# Patient Record
Sex: Male | Born: 1945 | Race: Black or African American | Hispanic: No | Marital: Married | Smoking: Never smoker
Health system: Southern US, Community
[De-identification: ages and names within clinical notes are randomized; demographics above are authoritative.]

## PROBLEM LIST (undated history)

## (undated) DIAGNOSIS — K635 Polyp of colon: Secondary | ICD-10-CM

## (undated) DIAGNOSIS — H409 Unspecified glaucoma: Secondary | ICD-10-CM

## (undated) DIAGNOSIS — D649 Anemia, unspecified: Secondary | ICD-10-CM

## (undated) HISTORY — DX: Polyp of colon: K63.5

## (undated) HISTORY — PX: VASECTOMY: SHX75

## (undated) HISTORY — DX: Unspecified glaucoma: H40.9

## (undated) HISTORY — DX: Anemia, unspecified: D64.9

---

## 1998-02-14 ENCOUNTER — Ambulatory Visit (HOSPITAL_COMMUNITY): Admission: RE | Admit: 1998-02-14 | Discharge: 1998-02-14 | Payer: Self-pay | Admitting: Gastroenterology

## 1998-12-13 ENCOUNTER — Encounter: Admission: RE | Admit: 1998-12-13 | Discharge: 1998-12-13 | Payer: Self-pay | Admitting: Internal Medicine

## 1998-12-13 ENCOUNTER — Encounter: Payer: Self-pay | Admitting: Internal Medicine

## 2000-11-13 ENCOUNTER — Encounter: Admission: RE | Admit: 2000-11-13 | Discharge: 2000-11-13 | Payer: Self-pay | Admitting: Internal Medicine

## 2000-11-13 ENCOUNTER — Encounter: Payer: Self-pay | Admitting: Internal Medicine

## 2003-03-15 ENCOUNTER — Encounter: Admission: RE | Admit: 2003-03-15 | Discharge: 2003-03-15 | Payer: Self-pay | Admitting: Internal Medicine

## 2004-02-28 ENCOUNTER — Ambulatory Visit (HOSPITAL_COMMUNITY): Admission: RE | Admit: 2004-02-28 | Discharge: 2004-02-28 | Payer: Self-pay | Admitting: Gastroenterology

## 2004-02-28 ENCOUNTER — Encounter (INDEPENDENT_AMBULATORY_CARE_PROVIDER_SITE_OTHER): Payer: Self-pay | Admitting: *Deleted

## 2005-10-14 ENCOUNTER — Encounter: Admission: RE | Admit: 2005-10-14 | Discharge: 2005-10-14 | Payer: Self-pay | Admitting: Internal Medicine

## 2008-03-22 ENCOUNTER — Encounter: Admission: RE | Admit: 2008-03-22 | Discharge: 2008-03-22 | Payer: Self-pay | Admitting: Internal Medicine

## 2009-11-13 ENCOUNTER — Emergency Department (HOSPITAL_COMMUNITY): Admission: EM | Admit: 2009-11-13 | Discharge: 2009-11-13 | Payer: Self-pay | Admitting: Emergency Medicine

## 2009-11-24 ENCOUNTER — Emergency Department (HOSPITAL_COMMUNITY): Admission: EM | Admit: 2009-11-24 | Discharge: 2009-11-24 | Payer: Self-pay | Admitting: Emergency Medicine

## 2010-04-24 LAB — URINE CULTURE: Colony Count: 15000

## 2010-04-24 LAB — URINALYSIS, ROUTINE W REFLEX MICROSCOPIC
Bilirubin Urine: NEGATIVE
Nitrite: NEGATIVE
Protein, ur: NEGATIVE mg/dL
Specific Gravity, Urine: 1.018 (ref 1.005–1.030)
Urobilinogen, UA: 0.2 mg/dL (ref 0.0–1.0)

## 2010-04-24 LAB — URINE MICROSCOPIC-ADD ON

## 2010-04-25 LAB — CBC
MCH: 31.9 pg (ref 26.0–34.0)
MCHC: 35 g/dL (ref 30.0–36.0)
MCV: 91.2 fL (ref 78.0–100.0)
Platelets: 135 10*3/uL — ABNORMAL LOW (ref 150–400)

## 2010-04-25 LAB — COMPREHENSIVE METABOLIC PANEL
ALT: 26 U/L (ref 0–53)
Alkaline Phosphatase: 87 U/L (ref 39–117)
GFR calc Af Amer: 44 mL/min — ABNORMAL LOW (ref >60)
Glucose, Bld: 93 mg/dL (ref 70–99)
Potassium: 3.5 mEq/L (ref 3.5–5.1)
Total Protein: 7.1 g/dL (ref 6.0–8.3)

## 2010-04-25 LAB — URINALYSIS, ROUTINE W REFLEX MICROSCOPIC
Bilirubin Urine: NEGATIVE
Ketones, ur: 40 mg/dL — AB
Protein, ur: NEGATIVE mg/dL
Urobilinogen, UA: 0.2 mg/dL (ref 0.0–1.0)

## 2010-04-25 LAB — URINE CULTURE
Colony Count: NO GROWTH
Culture  Setup Time: 201110042323
Culture: NO GROWTH

## 2010-04-25 LAB — DIFFERENTIAL
Basophils Relative: 0 % (ref 0–1)
Eosinophils Absolute: 0 10*3/uL (ref 0.0–0.7)
Monocytes Absolute: 0.6 10*3/uL (ref 0.1–1.0)
Monocytes Relative: 7 % (ref 3–12)
Neutrophils Relative %: 86 % — ABNORMAL HIGH (ref 43–77)

## 2010-06-28 NOTE — Op Note (Signed)
Billy Norman, Billy Norman                ACCOUNT NO.:  [id number]   MEDICAL RECORD NO.:  [id number]          PATIENT TYPE:  AMB   LOCATION:  ENDO                         FACILITY:  [facility]   PHYSICIAN:  Anselmo Rod, M.D.  DATE OF BIRTH:  1945/08/28   DATE OF PROCEDURE:  02/28/2004  DATE OF DISCHARGE:                                 OPERATIVE REPORT   PROCEDURE PERFORMED:  Colonoscopy with cold biopsies x 2.   ENDOSCOPIST:  Anselmo Rod, M.D.   INSTRUMENT USED:  Olympus video colonoscope.   INDICATIONS FOR PROCEDURE:  A 65 year old, African-American male undergoing  screening colonoscopy to rule out colonic polyps, masses, etc.   PREPROCEDURE PREPARATION:  Informed consent was procured from the patient.  The patient was fasted for 8 hours prior to the procedure and prepped with a  bottle of magnesium citrate and a gallon of GoLYTELY the night prior to the  procedure.  Risks and benefits of the procedure, including a 10% missed rate  of cancer and polyps was discussed with the patient as well.   PREPROCEDURE PHYSICAL:  VITAL SIGNS:  The patient with stable vital signs.  NECK:  Supple.  CHEST:  Clear to auscultation.  CARDIAC:  S1, S2, regular.  ABDOMEN:  Soft with normal bowel sounds.   DESCRIPTION OF PROCEDURE:  The patient was placed in the left lateral  decubitus position, sedated with 100 mg of Demerol and 7.5 mg of Versed in  slow, incremental doses.  Once the patient was adequately sedated and  maintained on low-flow oxygen and continuous cardiac monitoring, the Olympus  video colonoscope was advanced from the rectum to the cecum, the appendiceal  orifice and ileocecal valve were visualized and photographed.  A small  sessile polyp was biopsied x 2 (cold biopsies) from the mid right colon.  There was no evidence of diverticulosis.  No other masses or polyps were  seen.  Retroflexion in the rectum revealed no abnormalities.  The patient  tolerated the procedure well without  complications.   IMPRESSION:  Essentially normal colonoscopy up to the cecum except for a  small sessile polyp biopsied x 2 (cold biopsies) from mid right colon.   RECOMMENDATIONS:  1.  Repeat colonoscopy depending on pathology results.  2.  Avoid nonsteroidals including aspirin for the next 2 weeks.  3.  Outpatient follow up as need arises in the future.      Jyot   JNM/MEDQ  D:  02/28/2004  T:  02/28/2004  Job:  [id number]   cc:   Minerva Areola L. August Saucer, M.D.  [address] [address]  [place]  [place] [address]  Fax: [phone]

## 2011-02-03 ENCOUNTER — Ambulatory Visit (INDEPENDENT_AMBULATORY_CARE_PROVIDER_SITE_OTHER): Payer: BC Managed Care – PPO

## 2011-02-03 DIAGNOSIS — S339XXA Sprain of unspecified parts of lumbar spine and pelvis, initial encounter: Secondary | ICD-10-CM

## 2011-06-13 ENCOUNTER — Ambulatory Visit
Admission: RE | Admit: 2011-06-13 | Discharge: 2011-06-13 | Disposition: A | Discharge status: Home / Self Care | Payer: BC Managed Care – PPO | Source: Ambulatory Visit | Attending: Internal Medicine | Admitting: Internal Medicine

## 2011-06-13 ENCOUNTER — Other Ambulatory Visit: Payer: Self-pay | Admitting: Internal Medicine

## 2011-06-13 DIAGNOSIS — R1030 Lower abdominal pain, unspecified: Secondary | ICD-10-CM

## 2011-10-13 IMAGING — CT CT ABD-PELV W/ CM
2 of 7 series · 16 of 46 positions shown, 18 images · IV contrast (water & 80ml omni 300)
Comparison: None.

CLINICAL DATA: Right lower quadrant pain

CT ABDOMEN WITH CONTRAST
TECHNIQUE: Multidetector CT imaging of the abdomen was performed
using the standard protocol following bolus administration of
intravenous contrast.
Contrast: 80 ml of Hmnipaque-055

[Series 2: routine abdomen · axial · 0.96mm/px · z∈[-412,-52]mm · 13 of 86 slices shown, 15 images]
[im 7/86  soft-tissue]
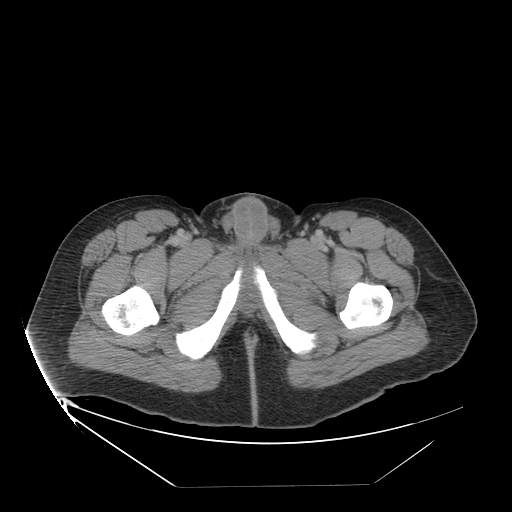
[im 7/86  bone]
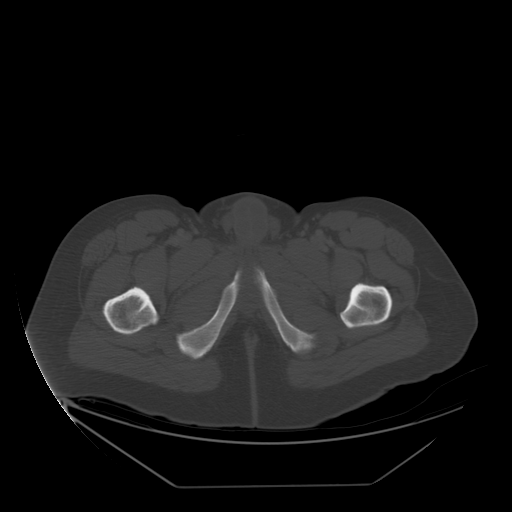
[im 13/86  soft-tissue]
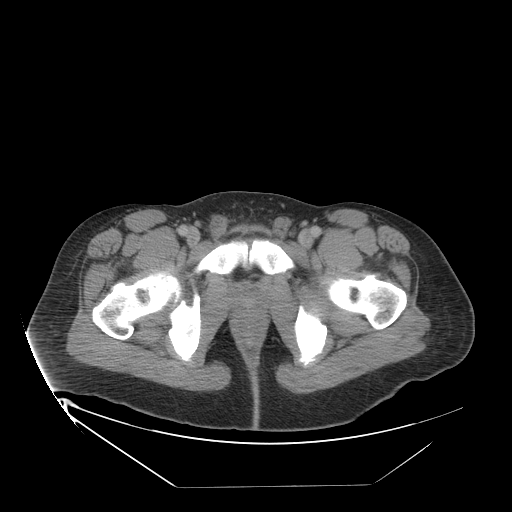
[im 19/86  soft-tissue]
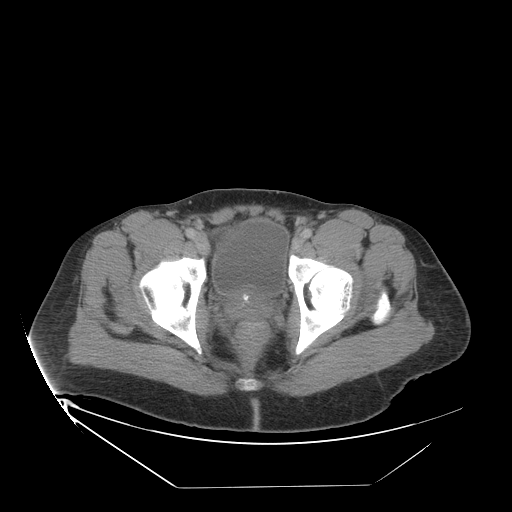
[im 25/86  soft-tissue]
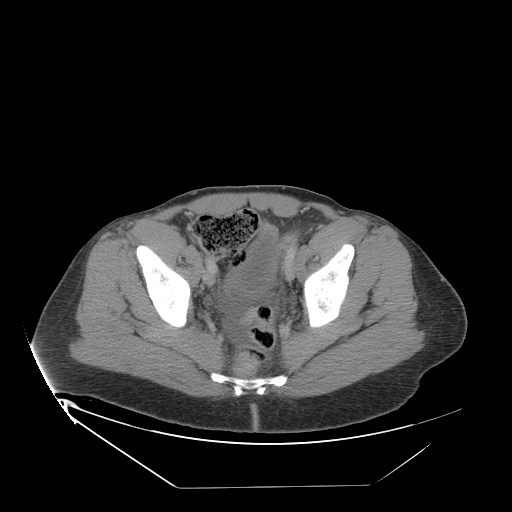
[im 31/86  soft-tissue]
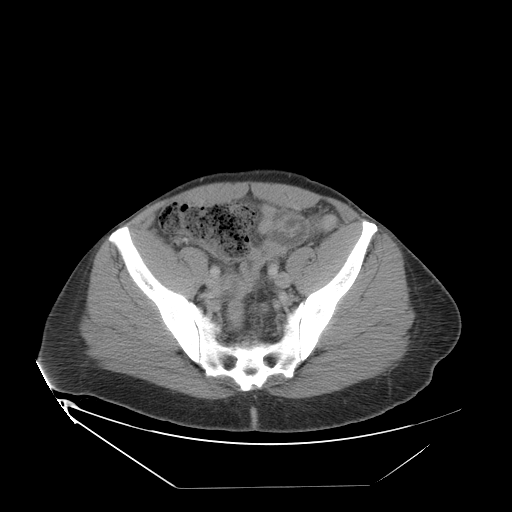
[im 37/86  soft-tissue]
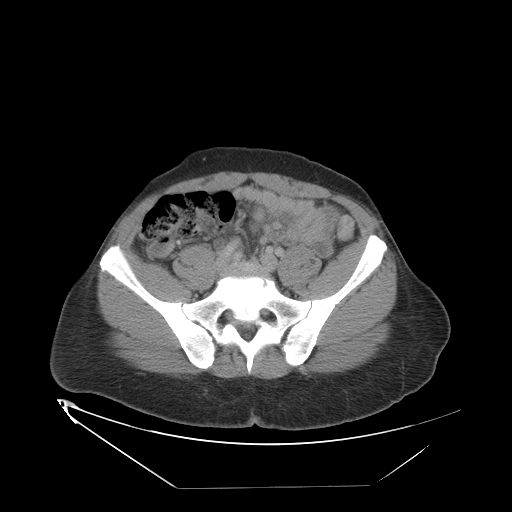
[im 43/86  soft-tissue]
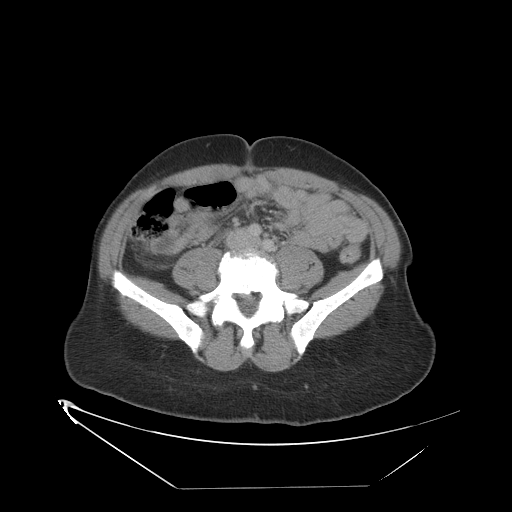
[im 49/86  soft-tissue]
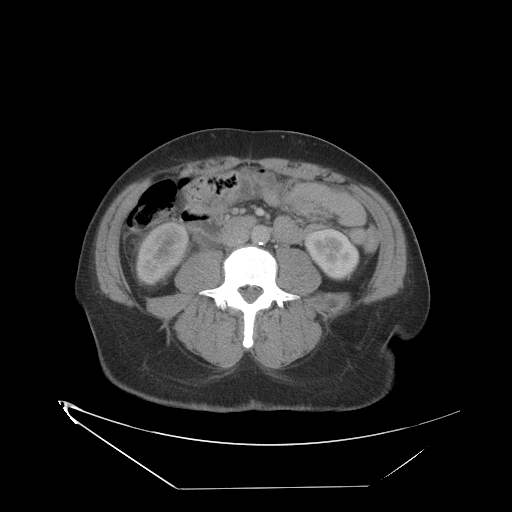
[im 55/86  soft-tissue]
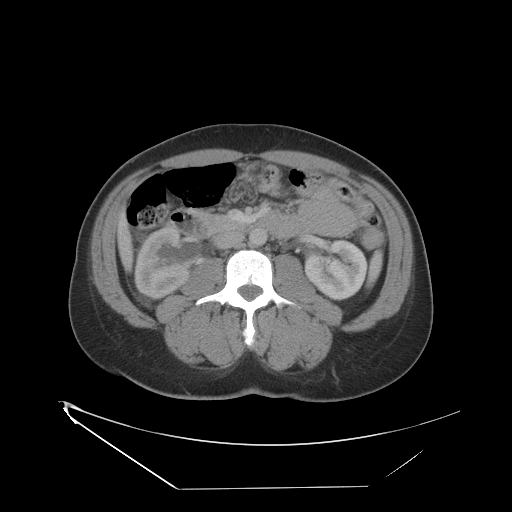
[im 55/86  bone]
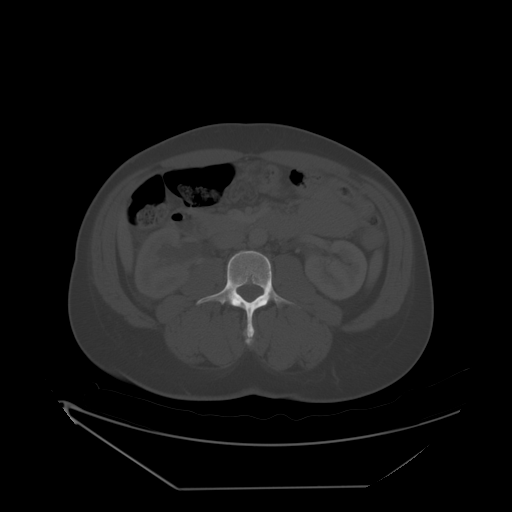
[im 61/86  soft-tissue]
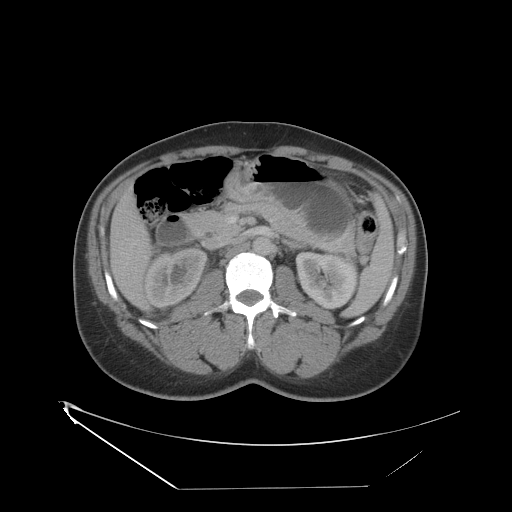
[im 67/86  soft-tissue]
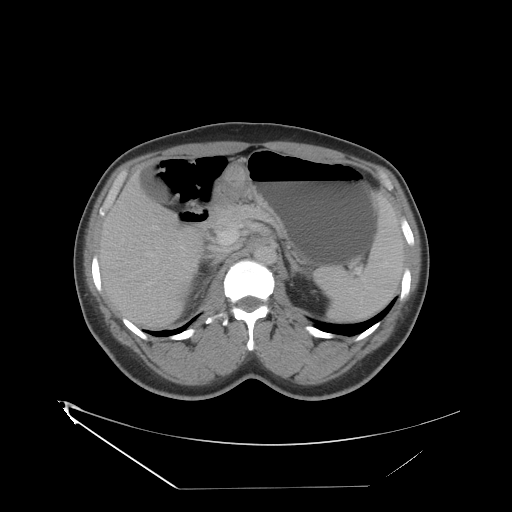
[im 73/86  soft-tissue]
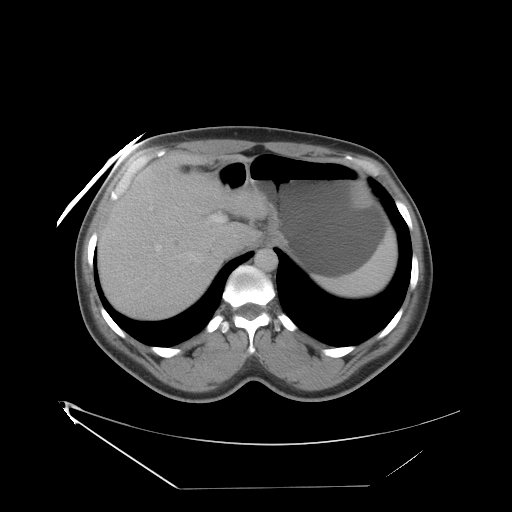
[im 79/86  soft-tissue]
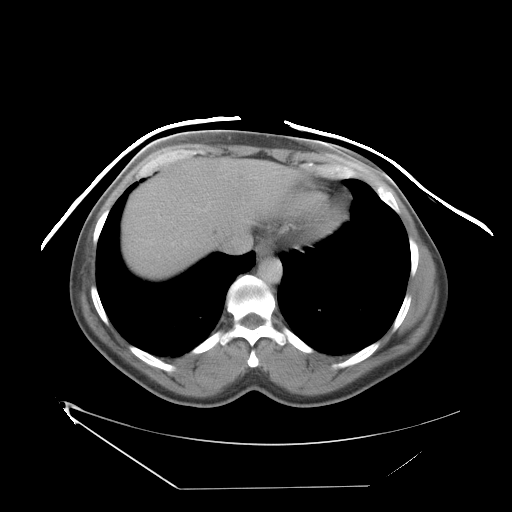

[Series 401: coronal · coronal · 0.96mm/px · 3 of 92 slices shown]
[im 31/92  soft-tissue]
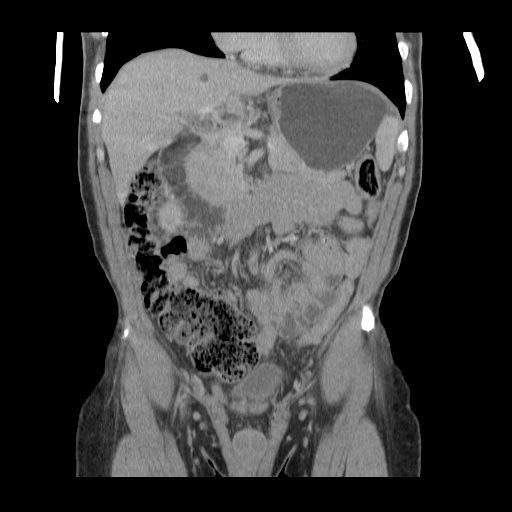
[im 41/92  soft-tissue]
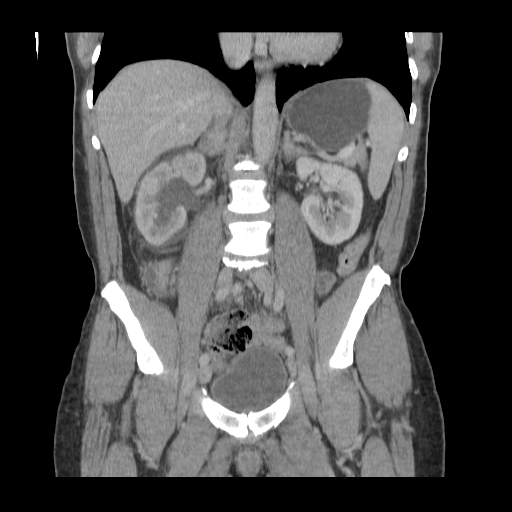
[im 51/92  soft-tissue]
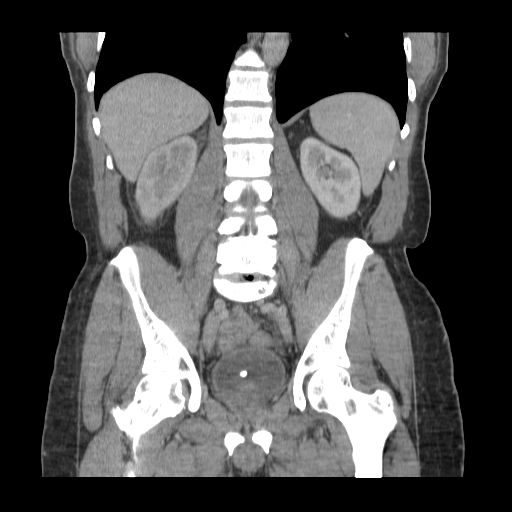

[16 of 46 positions shown; findings below may reference images not displayed]

FINDINGS: The lung bases are clear.  There are six sub centimeter
hypoattenuating lesions seen in the liver involving both lobes
which are too small to characterize.  The liver is otherwise
normal.  The spleen, adrenal glands, gallbladder and pancreas are
within normal limits.  There is asymmetric enhancement the kidneys
with delayed enhancement and excretion in the right kidney.  An 8
mm simple cyst is seen from the upper pole right kidney.  There is
increased perinephric stranding and enlargement of the right kidney
and hydronephrosis.  There is a punctate nonobstructing stone in
the anterior interpolar region.  The ureter is dilated to the UV
junction where there is an oval 0.8 x 0.6 cm stone.  No left
ureteral stone is seen.  There is a dependent calcification seen
along the bladder wall, likely, a previously passed stone.  The
prostate is within normal limits.  There is a small volume of free
fluid seen dependently in the pelvis.  The stomach is incompletely
distended.  There is no small bowel obstruction identified.  Normal
appendix is seen in the right lower quadrant.  There is a large
volume of stool seen particularly involving the right colon.  No
aggressive lytic or blastic bone lesions are seen.  Severe
degenerative changes are seen L4-L5.
IMPRESSION: 1.  Obstructing right UVJ stone with delayed enhancement and
excretion in the right kidney.
2.  Incidental findings as detailed above.  J

## 2011-10-29 ENCOUNTER — Ambulatory Visit (INDEPENDENT_AMBULATORY_CARE_PROVIDER_SITE_OTHER): Payer: BC Managed Care – PPO | Admitting: Family Medicine

## 2011-10-29 ENCOUNTER — Ambulatory Visit: Payer: BC Managed Care – PPO

## 2011-10-29 VITALS — BP 130/84 | HR 70 | Temp 97.9°F | Resp 16 | Ht 72.0 in | Wt 216.2 lb

## 2011-10-29 DIAGNOSIS — R109 Unspecified abdominal pain: Secondary | ICD-10-CM

## 2011-10-29 LAB — POCT URINALYSIS DIPSTICK
Protein, UA: 30
Spec Grav, UA: 1.015
Urobilinogen, UA: 0.2

## 2011-10-29 LAB — POCT UA - MICROSCOPIC ONLY
Crystals, Ur, HPF, POC: NEGATIVE
Yeast, UA: NEGATIVE

## 2011-10-29 MED ORDER — ONDANSETRON HCL 4 MG PO TABS: 4.0000 mg | ORAL_TABLET | Freq: Three times a day (TID) | ORAL | Status: DC | PRN

## 2011-10-29 MED ORDER — TAMSULOSIN HCL 0.4 MG PO CAPS: 0.4000 mg | ORAL_CAPSULE | Freq: Every day | ORAL | Status: DC

## 2011-10-29 NOTE — Progress Notes (Signed)
  Subjective:    Patient ID: Billy Norman., male    DOB: January 12, 1946, 66 y.o.   MRN: [id number]  HPI Pt presents today with chief complaint of nausea.  Pt states that he has a baseline of kidney stones. Pt believes that he may have passed a kidney stone No fevers or chills.  Pain is predominantly R sided.  Sxs fairly mild in comparison to previous renal stones.  Minimal UTI sxs.     Review of Systems  All other systems reviewed and are negative.       Objective:   Physical Exam  Constitutional: He appears well-developed and well-nourished.  HENT:  Head: Normocephalic and atraumatic.  Right Ear: External ear normal.  Left Ear: External ear normal.  Eyes: Conjunctivae normal are normal. Pupils are equal, round, and reactive to light.  Neck: Normal range of motion.  Cardiovascular: Normal rate and regular rhythm.   Pulmonary/Chest: Effort normal.  Abdominal: Soft. He exhibits no distension. There is no tenderness. There is no rebound and no guarding.       Mild R sided flank pain.    Musculoskeletal: Normal range of motion.  Neurological: He is alert.  Skin: Skin is warm.     [facility] reading (PRIMARY) by  Dr. Alvester Morin: No overt kidney stones on preliminary imaging.        Assessment & Plan:  Will treat symptomatically with zofran.  Flomax and strainer at home.  No signs of urinary infection on UA.  Follow up if sxs persist vs. Urology.

## 2012-04-01 ENCOUNTER — Encounter: Payer: Self-pay | Admitting: *Deleted

## 2012-08-07 ENCOUNTER — Encounter: Payer: Self-pay | Admitting: Family Medicine

## 2012-08-07 ENCOUNTER — Ambulatory Visit (INDEPENDENT_AMBULATORY_CARE_PROVIDER_SITE_OTHER): Payer: Medicare Other | Admitting: Family Medicine

## 2012-08-07 DIAGNOSIS — T6391XA Toxic effect of contact with unspecified venomous animal, accidental (unintentional), initial encounter: Secondary | ICD-10-CM

## 2012-08-07 MED ORDER — PREDNISONE 20 MG PO TABS: ORAL_TABLET | ORAL | Status: DC

## 2012-08-07 NOTE — Progress Notes (Signed)
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 He's been stung before and generally does not need medical attention for this. Nevertheless he started have some swelling around his right eye and wanted some attention.  Patient does have an EpiPen available. He's had no shortness of breath, chest pain, mild swelling  Objective: Mild swelling below the right eye where he was stung says he with erythema.  Has a 1 cm area of erythema in the right antecubital area..  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  Signed, Lewayne Bunting M.D.

## 2012-08-07 NOTE — Patient Instructions (Addendum)
Bee, Wasp, or Hornet Sting °Your caregiver has diagnosed you as having an insect sting. An insect sting appears as a red lump in the skin that sometimes has a tiny hole in the center, or it may have a stinger in the center of the wound. The most common stings are from wasps, hornets and bees. °Individuals have different reactions to insect stings. °· A normal reaction may cause pain, swelling, and redness around the sting site. °· A localized allergic reaction may cause swelling and redness that extends beyond the sting site. °· A large local reaction may continue to develop over the next 12 to 36 hours. °· On occasion, the reactions can be severe (anaphylactic reaction). An anaphylactic reaction may cause wheezing; difficulty breathing; chest pain; fainting; raised, itchy, red patches on the skin; a sick feeling to your stomach (nausea); vomiting; cramping; or diarrhea. If you have had an anaphylactic reaction to an insect sting in the past, you are more likely to have one again. °HOME CARE INSTRUCTIONS  °· With bee stings, a small sac of poison is left in the wound. Brushing across this with something such as a credit card, or anything similar, will help remove this and decrease the amount of the reaction. This same procedure will not help a wasp sting as they do not leave behind a stinger and poison sac. °· Apply a cold compress for 10 to 20 minutes every hour for 1 to 2 days, depending on severity, to reduce swelling and itching. °· To lessen pain, a paste made of water and baking soda may be rubbed on the bite or sting and left on for 5 minutes. °· To relieve itching and swelling, you may use take medication or apply medicated creams or lotions as directed. °· Only take over-the-counter or prescription medicines for pain, discomfort, or fever as directed by your caregiver. °· Wash the sting site daily with soap and water. Apply antibiotic ointment on the sting site as directed. °· If you suffered a severe  reaction: °· If you did not require hospitalization, an adult will need to stay with you for 24 hours in case the symptoms return. °· You may need to wear a medical bracelet or necklace stating the allergy. °· You and your family need to learn when and how to use an anaphylaxis kit or epinephrine injection. °· If you have had a severe reaction before, always carry your anaphylaxis kit with you. °SEEK MEDICAL CARE IF:  °· None of the above helps within 2 to 3 days. °· The area becomes red, warm, tender, and swollen beyond the area of the bite or sting. °· You have an oral temperature above 102° F (38.9° C). °SEEK IMMEDIATE MEDICAL CARE IF:  °You have symptoms of an allergic reaction which are: °· Wheezing. °· Difficulty breathing. °· Chest pain. °· Lightheadedness or fainting. °· Itchy, raised, red patches on the skin. °· Nausea, vomiting, cramping or diarrhea. °ANY OF THESE SYMPTOMS MAY REPRESENT A SERIOUS PROBLEM THAT IS AN EMERGENCY. Do not wait to see if the symptoms will go away. Get medical help right away. Call your local emergency services (911 in [place] DO NOT drive yourself to the hospital. °MAKE SURE YOU:  °· Understand these instructions. °· Will watch your condition. °· Will get help right away if you are not doing well or get worse. °Document Released: 01/27/2005 Document Revised: 04/21/2011 Document Reviewed: 07/14/2009 °ExitCare® Patient Information ©2014 [employer]. ° °

## 2013-07-07 ENCOUNTER — Telehealth: Payer: Self-pay

## 2013-07-07 ENCOUNTER — Ambulatory Visit (INDEPENDENT_AMBULATORY_CARE_PROVIDER_SITE_OTHER): Payer: Medicare Other | Admitting: Family Medicine

## 2013-07-07 VITALS — BP 110/68 | HR 59 | Temp 97.4°F | Resp 16 | Ht 71.75 in | Wt 213.0 lb

## 2013-07-07 DIAGNOSIS — T6391XA Toxic effect of contact with unspecified venomous animal, accidental (unintentional), initial encounter: Secondary | ICD-10-CM

## 2013-07-07 DIAGNOSIS — T63481A Toxic effect of venom of other arthropod, accidental (unintentional), initial encounter: Secondary | ICD-10-CM

## 2013-07-07 MED ORDER — MUPIROCIN 2 % EX OINT: 1.0000 "application " | TOPICAL_OINTMENT | Freq: Three times a day (TID) | CUTANEOUS | Status: DC

## 2013-07-07 NOTE — Patient Instructions (Signed)
Bee, Wasp, or Hornet Sting Your caregiver has diagnosed you as having an insect sting. An insect sting appears as a red lump in the skin that sometimes has a tiny hole in the center, or it may have a stinger in the center of the wound. The most common stings are from wasps, hornets and bees. Individuals have different reactions to insect stings.  A normal reaction may cause pain, swelling, and redness around the sting site.  A localized allergic reaction may cause swelling and redness that extends beyond the sting site.  A large local reaction may continue to develop over the next 12 to 36 hours.  On occasion, the reactions can be severe (anaphylactic reaction). An anaphylactic reaction may cause wheezing; difficulty breathing; chest pain; fainting; raised, itchy, red patches on the skin; a sick feeling to your stomach (nausea); vomiting; cramping; or diarrhea. If you have had an anaphylactic reaction to an insect sting in the past, you are more likely to have one again. HOME CARE INSTRUCTIONS   With bee stings, a small sac of poison is left in the wound. Brushing across this with something such as a credit card, or anything similar, will help remove this and decrease the amount of the reaction. This same procedure will not help a wasp sting as they do not leave behind a stinger and poison sac.  Apply a cold compress for 10 to 20 minutes every hour for 1 to 2 days, depending on severity, to reduce swelling and itching.  To lessen pain, a paste made of water and baking soda may be rubbed on the bite or sting and left on for 5 minutes.  To relieve itching and swelling, you may use take medication or apply medicated creams or lotions as directed.  Only take over-the-counter or prescription medicines for pain, discomfort, or fever as directed by your caregiver.  Wash the sting site daily with soap and water. Apply antibiotic ointment on the sting site as directed.  If you suffered a severe  reaction:  If you did not require hospitalization, an adult will need to stay with you for 24 hours in case the symptoms return.  You may need to wear a medical bracelet or necklace stating the allergy.  You and your family need to learn when and how to use an anaphylaxis kit or epinephrine injection.  If you have had a severe reaction before, always carry your anaphylaxis kit with you. SEEK MEDICAL CARE IF:   None of the above helps within 2 to 3 days.  The area becomes red, warm, tender, and swollen beyond the area of the bite or sting.  You have an oral temperature above 102 F (38.9 C). SEEK IMMEDIATE MEDICAL CARE IF:  You have symptoms of an allergic reaction which are:  Wheezing.  Difficulty breathing.  Chest pain.  Lightheadedness or fainting.  Itchy, raised, red patches on the skin.  Nausea, vomiting, cramping or diarrhea. ANY OF THESE SYMPTOMS MAY REPRESENT A SERIOUS PROBLEM THAT IS AN EMERGENCY. Do not wait to see if the symptoms will go away. Get medical help right away. Call your local emergency services (911 in [place] DO NOT drive yourself to the hospital. MAKE SURE YOU:   Understand these instructions.  Will watch your condition.  Will get help right away if you are not doing well or get worse. Document Released: 01/27/2005 Document Revised: 04/21/2011 Document Reviewed: 07/14/2009 ExitCare Patient Information 2014 [employer].  

## 2013-07-07 NOTE — Progress Notes (Signed)
Subjective:  This chart was scribed for Billy Sorenson, MD by Billy Norman, ED Scribe. This patient was seen in room Room/bed 3 and the patient's care was started at 1:33 PM.   Patient ID: Billy Norman., male    DOB: August 05, 1945, 68 y.o.   MRN: [id number]   Chief Complaint  Patient presents with  . Insect Bite    LEFT FOOT AND LEG X 3 DAY    HPI  HPI Comments: Billy Norman. is a 68 y.o. male who presents to the [facility] complaining of bee stings to his left foot and leg that occurred 3 days ago. 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 There is associated lesions and drainage on his left foot, left knee, and left thigh. Patient states he tried top Benadryl with no improvement after initially washing them with rubbing alcohol.  He is allergic to topical neomycin and worries they have become infected.    Past Medical History  Diagnosis Date  . Anemia   . Colon polyps   . Glaucoma    Current Outpatient Prescriptions on File Prior to Visit  Medication Sig Dispense Refill  . b complex vitamins tablet Take 1 tablet by mouth daily.      . cholecalciferol (VITAMIN D) 1000 UNITS tablet Take 1,000 Units by mouth daily.      Marland Kitchen glucosamine-chondroitin 500-400 MG tablet Take 1 tablet by mouth 1 day or 1 dose.      . latanoprost (XALATAN) 0.005 % ophthalmic solution Place 1 drop into both eyes at bedtime.      Marland Kitchen loratadine (CLARITIN) 10 MG tablet Take 10 mg by mouth daily.      . Multiple Vitamin (MULTIVITAMIN) tablet Take 1 tablet by mouth daily.      . ondansetron (ZOFRAN) 4 MG tablet Take 1 tablet (4 mg total) by mouth every 8 (eight) hours as needed for  nausea.  20 tablet  0  . predniSONE (DELTASONE) 20 MG tablet 2 daily with food  10 tablet  1  . Tamsulosin HCl (FLOMAX) 0.4 MG CAPS Take 1 capsule (0.4 mg total) by mouth daily.  30 capsule  0   No current facility-administered medications on file prior to visit.   Allergies  Allergen Reactions  . Neomycin Rash    Review of Systems  Constitutional: Positive for diaphoresis. Negative for fever, chills, activity change, appetite change and fatigue.  Cardiovascular: Negative for leg swelling.  Gastrointestinal: Negative for nausea, vomiting, abdominal pain, diarrhea and constipation.  Endocrine: Positive for heat intolerance.  Musculoskeletal: Positive for myalgias. Negative for gait problem and joint swelling.  Skin: Positive for rash and wound. Negative for pallor.       Insect bites  Neurological: Negative for weakness and numbness.  Hematological: Negative for adenopathy. Does not bruise/bleed easily.      BP 110/68  Pulse 59  Temp(Src) 97.4 F (36.3 C) (Oral)  Resp 16  Ht 5' 11.75" (1.822 m)  Wt 213 lb (96.616 kg)  BMI 29.10 kg/m2  SpO2 98% Objective:   Physical Exam  Nursing note and vitals  reviewed. Constitutional: He is oriented to person, place, and time. He appears well-developed and well-nourished. No distress.  HENT:  Head: Normocephalic and atraumatic.  Eyes: EOM are normal.  Neck: Neck supple. No tracheal deviation present.  Cardiovascular: Normal rate.   Pulmonary/Chest: Effort normal. No respiratory distress.  Musculoskeletal: Normal range of motion.  Neurological: He is alert and oriented to person, place, and time.  Skin: Skin is warm and dry. There is erythema.  Erythematous blanchable macule on dorsal aspect of 1st Lt MTP of 1.5-2cm dm with 1 cm of induration. No tenderness, no warmth. Central to rash lesion is .5 cm plaque with small of serosanguinous drainage. Similar but smaller lesions also present on medial aspect of distal knee and posterior thigh.    Psychiatric: He has a normal mood and affect. His behavior is normal.       Assessment & Plan:   Insect sting - wash w/ warm soapy water tid followed by bactroban - suspect that lesions are actually inflammatory reaction to sting rather than infection but pt counselled to watch for any increased pain, redness, swelling, or purulent drainage. If worsening patient will call and I will call in a course of Bactrim. Pt understands and is agreeable to plan. Meds ordered this encounter  Medications  . mupirocin ointment (BACTROBAN) 2 %    Sig: Apply 1 application topically 3 (three) times daily.    Dispense:  22 g    Refill:  1    I personally performed the services described in this documentation, which was scribed in my presence. The recorded information has been reviewed and considered, and addended by me as needed.  Billy SorensonEva David Towson, MD MPH

## 2013-07-07 NOTE — Telephone Encounter (Signed)
Patient wants his future scripts to go to [facility] on [address].  It is closer to his home.   [phone]

## 2013-07-07 NOTE — Telephone Encounter (Signed)
Pt will need the pharmacy to transfer the prescriptions  Changed pharmacy in system.

## 2013-09-28 ENCOUNTER — Emergency Department (HOSPITAL_COMMUNITY)
Admission: EM | Admit: 2013-09-28 | Discharge: 2013-09-28 | Disposition: A | Discharge status: Home / Self Care | Payer: Medicare Other | Attending: Emergency Medicine | Admitting: Emergency Medicine

## 2013-09-28 ENCOUNTER — Emergency Department (HOSPITAL_COMMUNITY): Payer: Medicare Other

## 2013-09-28 ENCOUNTER — Encounter (HOSPITAL_COMMUNITY): Payer: Self-pay | Admitting: Emergency Medicine

## 2013-09-28 DIAGNOSIS — D649 Anemia, unspecified: Secondary | ICD-10-CM | POA: Diagnosis not present

## 2013-09-28 DIAGNOSIS — Z860100 Personal history of colon polyps, unspecified: Secondary | ICD-10-CM | POA: Insufficient documentation

## 2013-09-28 DIAGNOSIS — R6883 Chills (without fever): Secondary | ICD-10-CM | POA: Diagnosis not present

## 2013-09-28 DIAGNOSIS — K5900 Constipation, unspecified: Secondary | ICD-10-CM | POA: Diagnosis not present

## 2013-09-28 DIAGNOSIS — R112 Nausea with vomiting, unspecified: Secondary | ICD-10-CM | POA: Insufficient documentation

## 2013-09-28 DIAGNOSIS — Z79899 Other long term (current) drug therapy: Secondary | ICD-10-CM | POA: Insufficient documentation

## 2013-09-28 DIAGNOSIS — H409 Unspecified glaucoma: Secondary | ICD-10-CM | POA: Diagnosis not present

## 2013-09-28 DIAGNOSIS — IMO0002 Reserved for concepts with insufficient information to code with codable children: Secondary | ICD-10-CM | POA: Insufficient documentation

## 2013-09-28 DIAGNOSIS — N200 Calculus of kidney: Secondary | ICD-10-CM | POA: Diagnosis not present

## 2013-09-28 DIAGNOSIS — R630 Anorexia: Secondary | ICD-10-CM | POA: Insufficient documentation

## 2013-09-28 DIAGNOSIS — R1084 Generalized abdominal pain: Secondary | ICD-10-CM | POA: Diagnosis present

## 2013-09-28 LAB — COMPREHENSIVE METABOLIC PANEL
ALT: 28 U/L (ref 0–53)
AST: 30 U/L (ref 0–37)
Albumin: 4 g/dL (ref 3.5–5.2)
Alkaline Phosphatase: 81 U/L (ref 39–117)
Anion gap: 15 (ref 5–15)
BUN: 20 mg/dL (ref 6–23)
CO2: 24 meq/L (ref 19–32)
CREATININE: 2.26 mg/dL — AB (ref 0.50–1.35)
Calcium: 9.5 mg/dL (ref 8.4–10.5)
Chloride: 101 mEq/L (ref 96–112)
GFR calc Af Amer: 33 mL/min — ABNORMAL LOW (ref >90)
GFR, EST NON AFRICAN AMERICAN: 28 mL/min — AB (ref >90)
GLUCOSE: 116 mg/dL — AB (ref 70–99)
Potassium: 4.5 mEq/L (ref 3.7–5.3)
Sodium: 140 mEq/L (ref 137–147)
Total Bilirubin: 1.7 mg/dL — ABNORMAL HIGH (ref 0.3–1.2)
Total Protein: 7 g/dL (ref 6.0–8.3)

## 2013-09-28 LAB — CBC WITH DIFFERENTIAL/PLATELET
Basophils Absolute: 0 10*3/uL (ref 0.0–0.1)
Basophils Relative: 0 % (ref 0–1)
EOS ABS: 0 10*3/uL (ref 0.0–0.7)
Eosinophils Relative: 0 % (ref 0–5)
HEMATOCRIT: 46.3 % (ref 39.0–52.0)
HEMOGLOBIN: 15.9 g/dL (ref 13.0–17.0)
LYMPHS ABS: 0.6 10*3/uL — AB (ref 0.7–4.0)
Lymphocytes Relative: 7 % — ABNORMAL LOW (ref 12–46)
MCH: 31.7 pg (ref 26.0–34.0)
MCHC: 34.3 g/dL (ref 30.0–36.0)
MCV: 92.2 fL (ref 78.0–100.0)
MONO ABS: 0.6 10*3/uL (ref 0.1–1.0)
MONOS PCT: 6 % (ref 3–12)
NEUTROS ABS: 8.2 10*3/uL — AB (ref 1.7–7.7)
NEUTROS PCT: 87 % — AB (ref 43–77)
Platelets: 131 10*3/uL — ABNORMAL LOW (ref 150–400)
RBC: 5.02 MIL/uL (ref 4.22–5.81)
RDW: 12 % (ref 11.5–15.5)
WBC: 9.5 10*3/uL (ref 4.0–10.5)

## 2013-09-28 LAB — URINE MICROSCOPIC-ADD ON

## 2013-09-28 LAB — URINALYSIS, ROUTINE W REFLEX MICROSCOPIC
BILIRUBIN URINE: NEGATIVE
GLUCOSE, UA: NEGATIVE mg/dL
Ketones, ur: 40 mg/dL — AB
NITRITE: NEGATIVE
Protein, ur: NEGATIVE mg/dL
Specific Gravity, Urine: 1.025 (ref 1.005–1.030)
Urobilinogen, UA: 0.2 mg/dL (ref 0.0–1.0)
pH: 6 (ref 5.0–8.0)

## 2013-09-28 LAB — LIPASE, BLOOD: LIPASE: 23 U/L (ref 11–59)

## 2013-09-28 MED ORDER — ONDANSETRON HCL 4 MG PO TABS: 4.0000 mg | ORAL_TABLET | Freq: Four times a day (QID) | ORAL | Status: DC

## 2013-09-28 MED ORDER — HYDROMORPHONE HCL PF 1 MG/ML IJ SOLN
1.0000 mg | Freq: Once | INTRAMUSCULAR | Status: AC
  Administered 2013-09-28: 1 mg via INTRAVENOUS
  Filled 2013-09-28: qty 1

## 2013-09-28 MED ORDER — TAMSULOSIN HCL 0.4 MG PO CAPS: 0.4000 mg | ORAL_CAPSULE | Freq: Every day | ORAL | Status: DC

## 2013-09-28 MED ORDER — OXYCODONE-ACETAMINOPHEN 5-325 MG PO TABS: 1.0000 | ORAL_TABLET | ORAL | Status: DC | PRN

## 2013-09-28 MED ORDER — SODIUM CHLORIDE 0.9 % IV BOLUS (SEPSIS)
1000.0000 mL | Freq: Once | INTRAVENOUS | Status: AC
  Administered 2013-09-28: 1000 mL via INTRAVENOUS

## 2013-09-28 MED ORDER — ONDANSETRON HCL 4 MG/2ML IJ SOLN
4.0000 mg | Freq: Once | INTRAMUSCULAR | Status: AC
  Administered 2013-09-28: 4 mg via INTRAVENOUS
  Filled 2013-09-28: qty 2

## 2013-09-28 NOTE — Discharge Instructions (Signed)

## 2013-09-28 NOTE — ED Notes (Signed)
Pt c/o left sided flank and abd pain x 2 days; pt with hx of kidney stones and feels same; pt sts N/V x 2 days

## 2013-09-28 NOTE — ED Provider Notes (Signed)
CSN: [id number]     Arrival date & time 09/28/13  1053 History   First MD Initiated Contact with Patient 09/28/13 1116     Chief Complaint  Patient presents with  . Flank Pain  . Abdominal Pain     (Consider location/radiation/quality/duration/timing/severity/associated sxs/prior Treatment) Patient is a 68 y.o. male presenting with abdominal pain.  Abdominal Pain Pain location:  L flank Pain quality: cramping   Pain radiates to:  Does not radiate Pain severity:  Moderate Onset quality:  Gradual Duration:  2 days Timing:  Constant Progression:  Partially resolved Chronicity:  Recurrent Context: not previous surgeries   Relieved by:  Nothing Worsened by:  Nothing tried Ineffective treatments:  None tried Associated symptoms: anorexia, chills, constipation, nausea and vomiting   Associated symptoms: no chest pain, no cough, no diarrhea, no dysuria, no fever, no hematuria, no shortness of breath and no sore throat     Past Medical History  Diagnosis Date  . Anemia   . Colon polyps   . Glaucoma    Past Surgical History  Procedure Laterality Date  . Vasectomy     Family History  Problem Relation Age of Onset  . Kidney disease Mother   . Prostate cancer Father   . Cancer Brother    History  Substance Use Topics  . Smoking status: Never Smoker   . Smokeless tobacco: Never Used  . Alcohol Use: No    Review of Systems  Constitutional: Positive for chills. Negative for fever.  HENT: Negative for congestion, rhinorrhea and sore throat.   Eyes: Negative for photophobia and visual disturbance.  Respiratory: Negative for cough and shortness of breath.   Cardiovascular: Negative for chest pain and leg swelling.  Gastrointestinal: Positive for nausea, vomiting, abdominal pain, constipation and anorexia. Negative for diarrhea.  Endocrine: Negative for polydipsia and polyuria.  Genitourinary: Negative for dysuria and hematuria.  Musculoskeletal: Negative for arthralgias and  back pain.  Skin: Negative for color change and rash.  Neurological: Negative for dizziness, syncope, light-headedness and headaches.  Hematological: Negative for adenopathy. Does not bruise/bleed easily.  All other systems reviewed and are negative.     Allergies  Neomycin  Home Medications   Prior to Admission medications   Medication Sig Start Date End Date Taking? Authorizing Provider  b complex vitamins tablet Take 1 tablet by mouth daily.   Yes Historical Provider, MD  cholecalciferol (VITAMIN D) 1000 UNITS tablet Take 1,000 Units by mouth daily.   Yes Historical Provider, MD  glucosamine-chondroitin 500-400 MG tablet Take 1 tablet by mouth daily.    Yes Historical Provider, MD  latanoprost (XALATAN) 0.005 % ophthalmic solution Place 1 drop into both eyes at bedtime.   Yes Historical Provider, MD  Multiple Vitamin (MULTIVITAMIN) tablet Take 1 tablet by mouth daily.   Yes Historical Provider, MD  VITAMIN A PO Take 1 tablet by mouth daily.   Yes Historical Provider, MD  vitamin C (ASCORBIC ACID) 500 MG tablet Take 3,000 mg by mouth at bedtime.   Yes Historical Provider, MD  VITAMIN E PO Take 1 tablet by mouth daily.   Yes Historical Provider, MD  ondansetron (ZOFRAN) 4 MG tablet Take 1 tablet (4 mg total) by mouth every 6 (six) hours. 09/28/13   Mirian MoMatthew Gentry, MD  oxyCODONE-acetaminophen (PERCOCET/ROXICET) 5-325 MG per tablet Take 1 tablet by mouth every 4 (four) hours as needed for severe pain. 09/28/13   Mirian MoMatthew Gentry, MD  tamsulosin (FLOMAX) 0.4 MG CAPS capsule Take 1 capsule (0.4  mg total) by mouth daily. 09/28/13   Mirian Mo, MD   BP 121/62  Pulse 64  Temp(Src) 98.4 F (36.9 C) (Oral)  Resp 16  Ht 6\' 1"  (1.854 m)  Wt 212 lb (96.163 kg)  BMI 27.98 kg/m2  SpO2 99% Physical Exam  Vitals reviewed. Constitutional: He is oriented to person, place, and time. He appears well-developed and well-nourished.  HENT:  Head: Normocephalic and atraumatic.  Eyes: Conjunctivae  and EOM are normal.  Neck: Normal range of motion. Neck supple.  Cardiovascular: Normal rate, regular rhythm and normal heart sounds.   Pulmonary/Chest: Effort normal and breath sounds normal. No respiratory distress.  Abdominal: He exhibits no distension. There is no tenderness. There is CVA tenderness (very mild L sided). There is no rebound and no guarding.  Musculoskeletal: Normal range of motion.  Neurological: He is alert and oriented to person, place, and time.  Skin: Skin is warm and dry.    ED Course  Procedures (including critical care time) Labs Review Labs Reviewed  CBC WITH DIFFERENTIAL - Abnormal; Notable for the following:    Platelets 131 (*)    Neutrophils Relative % 87 (*)    Neutro Abs 8.2 (*)    Lymphocytes Relative 7 (*)    Lymphs Abs 0.6 (*)    All other components within normal limits  COMPREHENSIVE METABOLIC PANEL - Abnormal; Notable for the following:    Glucose, Bld 116 (*)    Creatinine, Ser 2.26 (*)    Total Bilirubin 1.7 (*)    GFR calc non Af Amer 28 (*)    GFR calc Af Amer 33 (*)    All other components within normal limits  URINALYSIS, ROUTINE W REFLEX MICROSCOPIC - Abnormal; Notable for the following:    APPearance HAZY (*)    Hgb urine dipstick SMALL (*)    Ketones, ur 40 (*)    Leukocytes, UA SMALL (*)    All other components within normal limits  URINE MICROSCOPIC-ADD ON - Abnormal; Notable for the following:    Squamous Epithelial / LPF FEW (*)    Bacteria, UA FEW (*)    All other components within normal limits  URINE CULTURE  LIPASE, BLOOD    Imaging Review Ct Abdomen Pelvis Wo Contrast  09/28/2013   CLINICAL DATA:  Two day history of left flank and abdominal pain  EXAM: CT ABDOMEN AND PELVIS WITHOUT CONTRAST  TECHNIQUE: Multidetector CT imaging of the abdomen and pelvis was performed following the standard protocol without IV contrast.  COMPARISON:  Prior pelvic radiograph 06/13/2011  FINDINGS: Lower Chest: The lung bases are clear.  Visualized cardiac structures are within normal limits for size. No pericardial effusion. Unremarkable visualized distal thoracic esophagus.  Abdomen: Unenhanced CT was performed per clinician order. Lack of IV contrast limits sensitivity and specificity, especially for evaluation of abdominal/pelvic solid viscera. Within these limitations, unremarkable CT appearance of the stomach, duodenum, spleen, adrenal glands and pancreas. There are several small circumscribed hypo dense lesions scattered throughout the liver measuring up to 8 mm in greatest diameter. These lesions are incompletely characterized in the absence of intravenous contrast. Statistically, they are likely benign cysts. Moderate left hydronephrosis. A relatively large 7.5 x 12 mm stone is lodged in the proximal left ureter. There is associated left renal edema and perinephric stranding. Nonobstructing 8 mm stone in the lower pole of the right kidney. 1.2 cm water attenuation hypoechoic lesion in the upper pole of the right kidney is incompletely characterized in the  absence of intravenous contrast. Statistically, this is likely a cyst.  No evidence of obstruction or focal bowel wall thickening. Normal appendix in the right lower quadrant. The terminal ileum is unremarkable.  Pelvis: Small volume free fluid within the recto vesicular recess. Coarse calcifications in the prostate gland. Decompressed bladder.  Bones/Soft Tissues: No acute fracture or aggressive appearing lytic or blastic osseous lesion. Multilevel lumbar degenerative disc disease most significant at L4-L5 and L5-S1.  Vascular: Limited evaluation in the absence of intravenous contrast. No aneurysmal dilatation. No significant atherosclerotic plaque.  IMPRESSION: 1. Large obstructing 7.5 x 12 mm stone in the proximal left ureter with resultant moderate left hydronephrosis, renal edema and perinephric stranding. Additionally, small free fluid in the rectovesicular recess is likely related  to the left renal obstruction. 2. Additional nonobstructing 8 mm stone in the lower pole of the right kidney. 3. Low-attenuation hepatic and right renal lesions are incompletely evaluated in the absence of intravenous contrast. In the absence of a known primary malignancy, these are statistically highly likely benign cysts. 4. Lower lumbar degenerative disc disease.   Electronically Signed   By: Malachy Moan M.D.   On: 09/28/2013 12:44     EKG Interpretation None      MDM   Final diagnoses:  Nephrolithiasis    68 y.o. male  with pertinent PMH of prior nephrolithiasis presents with left-sided flank pain since last night. Patient initially stated that symptoms were similar to prior kidney stone, however on repeat questioning the patient states that his current symptoms are different and has not had a bowel movement for 2 days, which he says is extremely unusual. He endorses chills, without measured temperature, nausea, vomiting , and denies diarrhea.  He states that the pain is improved since last night. Physical exam as above with very mild left-sided CVA tenderness and intermittent generalized abdominal tenderness.  Given lack of recent DM and nausea and vomiting with unclear history of stone, will obtain CT scan of abdomen and pelvis.  Labs and imaging as above reviewed. Patient with obstructive 12 mm left proximal ureteral stone as well as 8 mm nonobstructive right stone. Creatinine elevated to 2.26. Consulted urology who recommended the patient be given option to followup as an outpatient. I discussed this with patient and specifically endorse strict return precautions for any fever, nausea, vomiting, or intolerable pain.  They will fu in 1-2 days for repeat cr draw.  Pt agreed with plan and will fu.    1. Nephrolithiasis         Mirian Mo, MD 09/28/13 1711

## 2013-09-29 ENCOUNTER — Encounter (HOSPITAL_COMMUNITY): Payer: Self-pay | Admitting: *Deleted

## 2013-09-29 ENCOUNTER — Encounter (HOSPITAL_COMMUNITY)
Admission: AD | Disposition: A | Discharge status: Home / Self Care | Payer: Self-pay | Source: Ambulatory Visit | Attending: Urology

## 2013-09-29 ENCOUNTER — Ambulatory Visit (HOSPITAL_COMMUNITY)
Admission: AD | Admit: 2013-09-29 | Discharge: 2013-09-29 | Disposition: A | Discharge status: Home / Self Care | Payer: 59 | Source: Ambulatory Visit | Attending: Urology | Admitting: Urology

## 2013-09-29 ENCOUNTER — Emergency Department (HOSPITAL_COMMUNITY)
Admission: EM | Admit: 2013-09-29 | Discharge: 2013-09-29 | Disposition: A | Discharge status: Home / Self Care | Payer: Medicare Other | Attending: Emergency Medicine | Admitting: Emergency Medicine

## 2013-09-29 ENCOUNTER — Other Ambulatory Visit: Payer: Self-pay | Admitting: Urology

## 2013-09-29 ENCOUNTER — Encounter (HOSPITAL_COMMUNITY): Payer: Self-pay | Admitting: Emergency Medicine

## 2013-09-29 ENCOUNTER — Ambulatory Visit (HOSPITAL_COMMUNITY): Payer: 59

## 2013-09-29 DIAGNOSIS — Z862 Personal history of diseases of the blood and blood-forming organs and certain disorders involving the immune mechanism: Secondary | ICD-10-CM | POA: Insufficient documentation

## 2013-09-29 DIAGNOSIS — Z860100 Personal history of colon polyps, unspecified: Secondary | ICD-10-CM | POA: Insufficient documentation

## 2013-09-29 DIAGNOSIS — R509 Fever, unspecified: Secondary | ICD-10-CM | POA: Insufficient documentation

## 2013-09-29 DIAGNOSIS — N201 Calculus of ureter: Secondary | ICD-10-CM | POA: Insufficient documentation

## 2013-09-29 DIAGNOSIS — Z79899 Other long term (current) drug therapy: Secondary | ICD-10-CM | POA: Diagnosis not present

## 2013-09-29 DIAGNOSIS — N23 Unspecified renal colic: Secondary | ICD-10-CM | POA: Diagnosis not present

## 2013-09-29 DIAGNOSIS — H409 Unspecified glaucoma: Secondary | ICD-10-CM | POA: Diagnosis not present

## 2013-09-29 LAB — COMPREHENSIVE METABOLIC PANEL
ALBUMIN: 3.5 g/dL (ref 3.5–5.2)
ALT: 24 U/L (ref 0–53)
ANION GAP: 13 (ref 5–15)
AST: 24 U/L (ref 0–37)
Alkaline Phosphatase: 66 U/L (ref 39–117)
BUN: 24 mg/dL — ABNORMAL HIGH (ref 6–23)
CO2: 22 mEq/L (ref 19–32)
CREATININE: 2.16 mg/dL — AB (ref 0.50–1.35)
Calcium: 8.9 mg/dL (ref 8.4–10.5)
Chloride: 105 mEq/L (ref 96–112)
GFR calc Af Amer: 35 mL/min — ABNORMAL LOW (ref >90)
GFR calc non Af Amer: 30 mL/min — ABNORMAL LOW (ref >90)
Glucose, Bld: 118 mg/dL — ABNORMAL HIGH (ref 70–99)
Potassium: 4.5 mEq/L (ref 3.7–5.3)
Sodium: 140 mEq/L (ref 137–147)
Total Bilirubin: 1.7 mg/dL — ABNORMAL HIGH (ref 0.3–1.2)
Total Protein: 6.3 g/dL (ref 6.0–8.3)

## 2013-09-29 LAB — URINALYSIS, ROUTINE W REFLEX MICROSCOPIC
Bilirubin Urine: NEGATIVE
Glucose, UA: NEGATIVE mg/dL
KETONES UR: 40 mg/dL — AB
NITRITE: NEGATIVE
PH: 5.5 (ref 5.0–8.0)
Protein, ur: NEGATIVE mg/dL
Specific Gravity, Urine: 1.022 (ref 1.005–1.030)
Urobilinogen, UA: 0.2 mg/dL (ref 0.0–1.0)

## 2013-09-29 LAB — CBC WITH DIFFERENTIAL/PLATELET
BASOS PCT: 0 % (ref 0–1)
Basophils Absolute: 0 10*3/uL (ref 0.0–0.1)
EOS ABS: 0 10*3/uL (ref 0.0–0.7)
EOS PCT: 0 % (ref 0–5)
HCT: 41.8 % (ref 39.0–52.0)
HEMOGLOBIN: 14.4 g/dL (ref 13.0–17.0)
Lymphocytes Relative: 4 % — ABNORMAL LOW (ref 12–46)
Lymphs Abs: 0.4 10*3/uL — ABNORMAL LOW (ref 0.7–4.0)
MCH: 31 pg (ref 26.0–34.0)
MCHC: 34.4 g/dL (ref 30.0–36.0)
MCV: 90.1 fL (ref 78.0–100.0)
MONO ABS: 1 10*3/uL (ref 0.1–1.0)
Monocytes Relative: 11 % (ref 3–12)
Neutro Abs: 8.3 10*3/uL — ABNORMAL HIGH (ref 1.7–7.7)
Neutrophils Relative %: 85 % — ABNORMAL HIGH (ref 43–77)
Platelets: 126 10*3/uL — ABNORMAL LOW (ref 150–400)
RBC: 4.64 MIL/uL (ref 4.22–5.81)
RDW: 12 % (ref 11.5–15.5)
WBC: 9.7 10*3/uL (ref 4.0–10.5)

## 2013-09-29 LAB — URINE CULTURE
CULTURE: NO GROWTH
Colony Count: NO GROWTH

## 2013-09-29 LAB — URINE MICROSCOPIC-ADD ON

## 2013-09-29 SURGERY — LITHOTRIPSY, ESWL
Anesthesia: LOCAL | Laterality: Left

## 2013-09-29 MED ORDER — DIAZEPAM 5 MG PO TABS
10.0000 mg | ORAL_TABLET | ORAL | Status: AC
  Administered 2013-09-29: 10 mg via ORAL
  Filled 2013-09-29: qty 2

## 2013-09-29 MED ORDER — DEXTROSE-NACL 5-0.45 % IV SOLN
INTRAVENOUS | Status: DC
  Administered 2013-09-29: 13:00:00 via INTRAVENOUS

## 2013-09-29 MED ORDER — CIPROFLOXACIN HCL 500 MG PO TABS
500.0000 mg | ORAL_TABLET | ORAL | Status: AC
  Administered 2013-09-29: 500 mg via ORAL
  Filled 2013-09-29: qty 1

## 2013-09-29 MED ORDER — DIPHENHYDRAMINE HCL 25 MG PO CAPS
25.0000 mg | ORAL_CAPSULE | ORAL | Status: AC
  Administered 2013-09-29: 25 mg via ORAL
  Filled 2013-09-29: qty 1

## 2013-09-29 NOTE — Discharge Instructions (Signed)
See [facility] discharge instructions in chart.  

## 2013-09-29 NOTE — ED Provider Notes (Signed)
CSN: [id number]     Arrival date & time 09/29/13  0246 History   First MD Initiated Contact with Patient 09/29/13 0535     Chief Complaint  Patient presents with  . Fever  . Tailbone Pain     (Consider location/radiation/quality/duration/timing/severity/associated sxs/prior Treatment) HPI Patient was seen in emergency department yesterday with left flank pain. Had a CT scan which showed a 7.5 x 12 mm stone in the proximal ureter. He had mild elevation in his creatinine. Discuss with urology at the time and agreed with discharge home to followup. Given return precautions to return for worsening pain and fever. Patient states that he started developing left-sided flank pain earlier this evening. He took he is pain medication, nausea medication and Flomax at home. By the time he reached the emergency department his pain has resolved. He currently is asymptomatic. He denies any abdominal pain or back pain. He has no nausea or vomiting. Past Medical History  Diagnosis Date  . Anemia   . Colon polyps   . Glaucoma    Past Surgical History  Procedure Laterality Date  . Vasectomy     Family History  Problem Relation Age of Onset  . Kidney disease Mother   . Prostate cancer Father   . Cancer Brother    History  Substance Use Topics  . Smoking status: Never Smoker   . Smokeless tobacco: Never Used  . Alcohol Use: No    Review of Systems  Constitutional: Negative for fever and chills.  Respiratory: Negative for shortness of breath.   Cardiovascular: Negative for chest pain.  Gastrointestinal: Positive for nausea, vomiting and abdominal pain. Negative for diarrhea.  Genitourinary: Positive for flank pain and difficulty urinating.  Musculoskeletal: Positive for back pain. Negative for neck pain and neck stiffness.  Skin: Negative for rash and wound.  Neurological: Negative for dizziness, weakness, light-headedness, numbness and headaches.  All other systems reviewed and are  negative.     Allergies  Neomycin  Home Medications   Prior to Admission medications   Medication Sig Start Date End Date Taking? Authorizing Provider  b complex vitamins tablet Take 1 tablet by mouth daily.    Historical Provider, MD  cholecalciferol (VITAMIN D) 1000 UNITS tablet Take 1,000 Units by mouth daily.    Historical Provider, MD  glucosamine-chondroitin 500-400 MG tablet Take 1 tablet by mouth daily.     Historical Provider, MD  latanoprost (XALATAN) 0.005 % ophthalmic solution Place 1 drop into both eyes at bedtime.    Historical Provider, MD  Multiple Vitamin (MULTIVITAMIN) tablet Take 1 tablet by mouth daily.    Historical Provider, MD  ondansetron (ZOFRAN) 4 MG tablet Take 1 tablet (4 mg total) by mouth every 6 (six) hours. 09/28/13   Mirian Mo, MD  oxyCODONE-acetaminophen (PERCOCET/ROXICET) 5-325 MG per tablet Take 1 tablet by mouth every 4 (four) hours as needed for severe pain. 09/28/13   Mirian Mo, MD  tamsulosin (FLOMAX) 0.4 MG CAPS capsule Take 1 capsule (0.4 mg total) by mouth daily. 09/28/13   Mirian Mo, MD  VITAMIN A PO Take 1 tablet by mouth daily.    Historical Provider, MD  vitamin C (ASCORBIC ACID) 500 MG tablet Take 3,000 mg by mouth at bedtime.    Historical Provider, MD  VITAMIN E PO Take 1 tablet by mouth daily.    Historical Provider, MD   BP 144/72  Pulse 87  Temp(Src) 98.4 F (36.9 C) (Oral)  Resp 22  SpO2 99% Physical Exam  Nursing note and vitals reviewed. Constitutional: He is oriented to person, place, and time. He appears well-developed and well-nourished. No distress.  HENT:  Head: Normocephalic and atraumatic.  Mouth/Throat: Oropharynx is clear and moist.  Eyes: EOM are normal. Pupils are equal, round, and reactive to light.  Neck: Normal range of motion. Neck supple.  Cardiovascular: Normal rate and regular rhythm.   Pulmonary/Chest: Effort normal and breath sounds normal. No respiratory distress. He has no wheezes. He has  no rales. He exhibits no tenderness.  Abdominal: Soft. Bowel sounds are normal. He exhibits no distension and no mass. There is no tenderness. There is no rebound and no guarding.  Musculoskeletal: Normal range of motion. He exhibits no edema and no tenderness.  No CVA tenderness.  Neurological: He is alert and oriented to person, place, and time.  Skin: Skin is warm and dry. No rash noted. No erythema.  Psychiatric: He has a normal mood and affect. His behavior is normal.    ED Course  Procedures (including critical care time) Labs Review Labs Reviewed  CBC WITH DIFFERENTIAL  COMPREHENSIVE METABOLIC PANEL  URINALYSIS, ROUTINE W REFLEX MICROSCOPIC    Imaging Review Ct Abdomen Pelvis Wo Contrast  09/28/2013   CLINICAL DATA:  Two day history of left flank and abdominal pain  EXAM: CT ABDOMEN AND PELVIS WITHOUT CONTRAST  TECHNIQUE: Multidetector CT imaging of the abdomen and pelvis was performed following the standard protocol without IV contrast.  COMPARISON:  Prior pelvic radiograph 06/13/2011  FINDINGS: Lower Chest: The lung bases are clear. Visualized cardiac structures are within normal limits for size. No pericardial effusion. Unremarkable visualized distal thoracic esophagus.  Abdomen: Unenhanced CT was performed per clinician order. Lack of IV contrast limits sensitivity and specificity, especially for evaluation of abdominal/pelvic solid viscera. Within these limitations, unremarkable CT appearance of the stomach, duodenum, spleen, adrenal glands and pancreas. There are several small circumscribed hypo dense lesions scattered throughout the liver measuring up to 8 mm in greatest diameter. These lesions are incompletely characterized in the absence of intravenous contrast. Statistically, they are likely benign cysts. Moderate left hydronephrosis. A relatively large 7.5 x 12 mm stone is lodged in the proximal left ureter. There is associated left renal edema and perinephric stranding.  Nonobstructing 8 mm stone in the lower pole of the right kidney. 1.2 cm water attenuation hypoechoic lesion in the upper pole of the right kidney is incompletely characterized in the absence of intravenous contrast. Statistically, this is likely a cyst.  No evidence of obstruction or focal bowel wall thickening. Normal appendix in the right lower quadrant. The terminal ileum is unremarkable.  Pelvis: Small volume free fluid within the recto vesicular recess. Coarse calcifications in the prostate gland. Decompressed bladder.  Bones/Soft Tissues: No acute fracture or aggressive appearing lytic or blastic osseous lesion. Multilevel lumbar degenerative disc disease most significant at L4-L5 and L5-S1.  Vascular: Limited evaluation in the absence of intravenous contrast. No aneurysmal dilatation. No significant atherosclerotic plaque.  IMPRESSION: 1. Large obstructing 7.5 x 12 mm stone in the proximal left ureter with resultant moderate left hydronephrosis, renal edema and perinephric stranding. Additionally, small free fluid in the rectovesicular recess is likely related to the left renal obstruction. 2. Additional nonobstructing 8 mm stone in the lower pole of the right kidney. 3. Low-attenuation hepatic and right renal lesions are incompletely evaluated in the absence of intravenous contrast. In the absence of a known primary malignancy, these are statistically highly likely benign cysts. 4. Lower lumbar degenerative  disc disease.   Electronically Signed   By: Malachy Moan M.D.   On: 09/28/2013 12:44     EKG Interpretation None      MDM   Final diagnoses:  None    We'll recheck labs/urine. Anticipate discharge home to followup with urology.  Patient remains asymptomatic. Crit is improving. No evidence of infection. Will followup with urology today. Return precautions given.  Loren Racer, MD 09/29/13 [id number]

## 2013-09-29 NOTE — H&P (Signed)
History of Present Illness   [name] presents today as a more urgent walk-in for newly diagnosed 8 mm x 12 mm left proximal ureteral stone with ongoing flank discomfort. [name] is currently 68 years of age. He has had 3-4 clinical episodes of nephrolithiasis in his life. I saw on 2 occasions 4 years ago after he was diagnosed with and subsequently passed an 8 mm ureteral calculus. Stone composition at that time revealed the stone to be primarily calcium oxalate monohydrate. He recently developed some recurrent renal colic. He reports actually some bilateral discomfort but recently his pain has been mostly on the left side. CT scan was performed yesterday. I have reviewed the images as well as the report. The patient has a proximally 8 mm x 12 mm stone which is obstructing in the left proximal ureter. The Hounsfield units are about 1200. Again, consistent with a probable monohydrate stone. In the lower pole of the right kidney there is also a nonobstructing 8 mm stone. The patient is currently n.p.o. and hopes that he might be able to get lithotripsy today. He is not having any significant voiding complaints. He is taking no aspirin or Ibuprofen. He has had ongoing and has had some continued nausea with a poor appetite. He has no evidence of infection based on his urinalysis in the emergency room. Urinalysis in our office today shows ongoing microhematuria without evidence of infection.      Past Medical History Problems  1. History of No significant past medical history  Surgical History Problems  1. Denied: History Of Prior Surgery  Current Meds 1. Flomax 0.4 MG Oral Capsule;  Therapy: (Recorded:20Aug2015) to Recorded 2. Ondansetron 8 MG Oral Tablet Dispersible;  Therapy: (Recorded:20Aug2015) to Recorded 3. Oxycodone-Acetaminophen CAPS;  Therapy: (Recorded:20Aug2015) to Recorded 4. Vitamins/Minerals TABS;  Therapy: (Recorded:20Aug2015) to Recorded  Allergies Medication  1. Neoporacin  OINT  Family History Problems  1. Family history of Family Health Status - Father's Age   84y/o 2. Family history of Family Health Status Number Of Children   1 son 3. Family history of prostate cancer (V78.46(V16.42) : Father 4. Family history of Kidney Cancer (V16.51) : Brother 5. Family history of Mother Deceased At Age ____   72y/o 6. No significant family history : Mother  Social History Problems    Denied: History of Alcohol Use   Caffeine Use   2-3 a week   Father's age   3665yrs   Marital History - Currently Married   Mother deceased   7227yrs   Never a smoker   Occupation:   project mgr/Retried   One child   Denied: History of Tobacco Use  Review of Systems Genitourinary, constitutional, skin, eye, otolaryngeal, hematologic/lymphatic, cardiovascular, pulmonary, endocrine, musculoskeletal, gastrointestinal, neurological and psychiatric system(s) were reviewed and pertinent findings if present are noted.  Gastrointestinal: nausea, vomiting, flank pain and abdominal pain.    Vitals Vital Signs [Data Includes: Last 1 Day]  Recorded: 20Aug2015 10:42AM  Height: 6 ft 1 in Weight: 209 lb 5 oz BMI Calculated: 27.62 BSA Calculated: 2.19 Blood Pressure: 121 / 70 Temperature: 98.4 F Heart Rate: 75  Physical Exam Constitutional: Well nourished and well developed . No acute distress.  ENT:. The ears and nose are normal in appearance.  Neck: The appearance of the neck is normal and no neck mass is present.  Cardiovascular: Heart rate and rhythm are normal . No peripheral edema.  Abdomen: The abdomen is soft and nontender. No masses are palpated. No CVA  tenderness. No hernias are palpable. No hepatosplenomegaly noted.  Lymphatics: The femoral and inguinal nodes are not enlarged or tender.  Skin: Normal skin turgor, no visible rash and no visible skin lesions.  Neuro/Psych:. Mood and affect are appropriate.    Results/Data Urine [Data Includes: Last 1 Day]    20Aug2015  COLOR YELLOW   APPEARANCE CLEAR   SPECIFIC GRAVITY 1.015   pH 6.0   GLUCOSE NEG mg/dL  BILIRUBIN NEG   KETONE 15 mg/dL  BLOOD LARGE   PROTEIN NEG mg/dL  UROBILINOGEN 0.2 mg/dL  NITRITE NEG   LEUKOCYTE ESTERASE NEG   SQUAMOUS EPITHELIAL/HPF RARE   WBC 0-2 WBC/hpf  RBC 7-10 RBC/hpf  BACTERIA RARE   CRYSTALS NONE SEEN   CASTS Hyaline casts noted   Other MUCUS NOTED    Assessment Assessed  1. Nephrolithiasis (592.0) 2. Calculus of ureter (592.1)  Plan Health Maintenance  1. UA With REFLEX; [Do Not Release]; Status:Resulted - Requires Verification;   Done:  20Aug2015 10:13AM  Discussion/Summary   Mr [name] has a large obstructing stone in the left proximal ureter. While he has a track record of passing some fairly impressive stones, this is the stone that has a very low likelihood of passing spontaneously. He is very interested in treatment. His preference would be ESWL. Given the current location and size of the stone, that is not unreasonable, but he is told that given the high density of the stone and the prior analysis showing monohydrate, that his chances of success are considerably lower than what we would typically see with lithotripsy. Average composition stone would probably have a 70% chance of having successful treatment without the need for additional treatment. I would suspect given the density of this stone, that the odds may be closer to 30%-40% that this stone will be treated successfully. It is possible the stone will not fragment at all. It is also possible that there will be poor fragmentation and he will need subsequent treatment to help with those fragments. Fortunately, given the fact that he has passed some large stones in the past, he may be able to handle some fairly large fragments if we can at least partially fragment the stone. Plan B option would be ureteroscopy with Holmium laser lithotripsy. He is still interested in an attempt at ESWL. If we do  find time to do that today, then I would plan on followup probably the earlier part of next week with one of our nurse practitioners and if there are ongoing evidence of significant large fragments or poor to no fragmentation, then we will try to get him on the schedule for a more definitive ureteroscopy with Holmium laser lithotripsy either on Friday of next week when I return or the following week. He reports normal voiding and PSA and rectal exam testing through his primary care provider.   cc: Willey Blade, MD   Signatures Electronically signed by : Barron Alvine, M.D.; Sep 29 2013 11:53AM EST

## 2013-09-29 NOTE — Op Note (Signed)
See [facility] OP note scanned into chart. 

## 2013-09-29 NOTE — Interval H&P Note (Signed)
History and Physical Interval Note:  09/29/2013 1:50 PM  Billy Mila MerryAllen Jr.  has presented today for surgery, with the diagnosis of Left Ureteral Calculus  The various methods of treatment have been discussed with the patient and family. After consideration of risks, benefits and other options for treatment, the patient has consented to  Procedure(s): LEFT EXTRACORPOREAL SHOCK WAVE LITHOTRIPSY (ESWL) (Left) as a surgical intervention .  The patient's history has been reviewed, patient examined, no change in status, stable for surgery.  I have reviewed the patient's chart and labs.  Questions were answered to the patient's satisfaction.     Ina Scrivens S

## 2013-09-29 NOTE — Discharge Instructions (Signed)

## 2013-09-29 NOTE — ED Notes (Addendum)
Elevated temp 99.4; c/o lower left flank pain. Kidney stones in both kidneys. Took percocet 200 am.

## 2014-12-11 ENCOUNTER — Other Ambulatory Visit: Payer: Self-pay | Admitting: Family Medicine

## 2015-05-24 DIAGNOSIS — H0011 Chalazion right upper eyelid: Secondary | ICD-10-CM | POA: Diagnosis not present

## 2015-05-24 DIAGNOSIS — H401132 Primary open-angle glaucoma, bilateral, moderate stage: Secondary | ICD-10-CM | POA: Diagnosis not present

## 2015-07-02 DIAGNOSIS — M549 Dorsalgia, unspecified: Secondary | ICD-10-CM | POA: Diagnosis not present

## 2015-07-02 DIAGNOSIS — N200 Calculus of kidney: Secondary | ICD-10-CM | POA: Diagnosis not present

## 2015-07-02 DIAGNOSIS — M5136 Other intervertebral disc degeneration, lumbar region: Secondary | ICD-10-CM | POA: Diagnosis not present

## 2015-07-02 DIAGNOSIS — J309 Allergic rhinitis, unspecified: Secondary | ICD-10-CM | POA: Diagnosis not present

## 2015-07-31 ENCOUNTER — Ambulatory Visit (INDEPENDENT_AMBULATORY_CARE_PROVIDER_SITE_OTHER): Payer: Medicare HMO | Admitting: Family Medicine

## 2015-07-31 VITALS — BP 132/80 | HR 79 | Temp 98.9°F | Resp 16 | Ht 71.0 in | Wt 204.0 lb

## 2015-07-31 DIAGNOSIS — L03311 Cellulitis of abdominal wall: Secondary | ICD-10-CM

## 2015-07-31 MED ORDER — DOXYCYCLINE HYCLATE 100 MG PO CAPS: 100.0000 mg | ORAL_CAPSULE | Freq: Two times a day (BID) | ORAL | Status: DC

## 2015-07-31 NOTE — Patient Instructions (Addendum)
     IF you received an x-ray today, you will receive an invoice from [facility]. Please contact [facility] at [phone] with questions or concerns regarding your invoice.   IF you received labwork today, you will receive an invoice from [facility]. Please contact [facility] at [phone] with questions or concerns regarding your invoice.   Our billing staff will not be able to assist you with questions regarding bills from these companies.  You will be contacted with the lab results as soon as they are available. The fastest way to get your results is to activate your My Chart account. Instructions are located on the last page of this paperwork. If you have not heard from us regarding the results in 2 weeks, please contact this office.      Cellulitis Cellulitis is an infection of the skin and the tissue beneath it. The infected area is usually red and tender. Cellulitis occurs most often in the arms and lower legs.  CAUSES  Cellulitis is caused by bacteria that enter the skin through cracks or cuts in the skin. The most common types of bacteria that cause cellulitis are staphylococci and streptococci. SIGNS AND SYMPTOMS   Redness and warmth.  Swelling.  Tenderness or pain.  Fever. DIAGNOSIS  Your health care provider can usually determine what is wrong based on a physical exam. Blood tests may also be done. TREATMENT  Treatment usually involves taking an antibiotic medicine. HOME CARE INSTRUCTIONS   Take your antibiotic medicine as directed by your health care provider. Finish the antibiotic even if you start to feel better.  Keep the infected arm or leg elevated to reduce swelling.  Apply a warm cloth to the affected area up to 4 times per day to relieve pain.  Take medicines only as directed by your health care provider.  Keep all follow-up visits as directed by your health care provider. SEEK MEDICAL CARE IF:   You  notice red streaks coming from the infected area.  Your red area gets larger or turns dark in color.  Your bone or joint underneath the infected area becomes painful after the skin has healed.  Your infection returns in the same area or another area.  You notice a swollen bump in the infected area.  You develop new symptoms.  You have a fever. SEEK IMMEDIATE MEDICAL CARE IF:   You feel very sleepy.  You develop vomiting or diarrhea.  You have a general ill feeling (malaise) with muscle aches and pains.   This information is not intended to replace advice given to you by your health care provider. Make sure you discuss any questions you have with your health care provider.   Document Released: 11/06/2004 Document Revised: 10/18/2014 Document Reviewed: 04/14/2011 Elsevier Interactive Patient Education 2016 [employer]  

## 2015-07-31 NOTE — Progress Notes (Signed)
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 He also has a scratchy throat.    the problem with the skin began about a week ago on his right thigh. It seemed to heal leaving a hyperpigmented area but then he developed a draining sore on the left side of his umbilicus.  He's developed some scratchy throat as well over the last couple days.  Objective:  BP 132/80 mmHg  Pulse 79  Temp(Src) 98.9 F (37.2 C) (Oral)  Resp 16  Ht 5\' 11"  (1.803 m)  Wt 204 lb (92.534 kg)  BMI 28.46 kg/m2  SpO2 98% Throat appears to be mildly erythematous with no exudates Neck: Supple no adenopathy Examination the skin reveals a fissured draining crusty half centimeter lesion on the left side of the umbilicus.  Assessment: Suspicious for staph infection  Plan: Doxycycline 100 mg twice a day 10 days. Continue washing with soap and water  Signed, Sheila OatsKurt Areg Bialas M.D.

## 2015-08-01 DIAGNOSIS — M5126 Other intervertebral disc displacement, lumbar region: Secondary | ICD-10-CM | POA: Diagnosis not present

## 2015-08-06 DIAGNOSIS — G8929 Other chronic pain: Secondary | ICD-10-CM | POA: Diagnosis not present

## 2015-08-06 DIAGNOSIS — M47816 Spondylosis without myelopathy or radiculopathy, lumbar region: Secondary | ICD-10-CM | POA: Diagnosis not present

## 2015-08-06 DIAGNOSIS — M5441 Lumbago with sciatica, right side: Secondary | ICD-10-CM | POA: Diagnosis not present

## 2015-08-06 DIAGNOSIS — Z6826 Body mass index (BMI) 26.0-26.9, adult: Secondary | ICD-10-CM | POA: Diagnosis not present

## 2015-08-21 DIAGNOSIS — H40013 Open angle with borderline findings, low risk, bilateral: Secondary | ICD-10-CM | POA: Diagnosis not present

## 2015-09-19 DIAGNOSIS — H40003 Preglaucoma, unspecified, bilateral: Secondary | ICD-10-CM | POA: Diagnosis not present

## 2015-10-01 DIAGNOSIS — M5136 Other intervertebral disc degeneration, lumbar region: Secondary | ICD-10-CM | POA: Diagnosis not present

## 2015-10-01 DIAGNOSIS — H409 Unspecified glaucoma: Secondary | ICD-10-CM | POA: Diagnosis not present

## 2015-10-01 DIAGNOSIS — J309 Allergic rhinitis, unspecified: Secondary | ICD-10-CM | POA: Diagnosis not present

## 2015-10-24 DIAGNOSIS — E039 Hypothyroidism, unspecified: Secondary | ICD-10-CM | POA: Diagnosis not present

## 2015-10-24 DIAGNOSIS — M549 Dorsalgia, unspecified: Secondary | ICD-10-CM | POA: Diagnosis not present

## 2015-10-24 DIAGNOSIS — E0590 Thyrotoxicosis, unspecified without thyrotoxic crisis or storm: Secondary | ICD-10-CM | POA: Diagnosis not present

## 2015-10-24 DIAGNOSIS — Z0000 Encounter for general adult medical examination without abnormal findings: Secondary | ICD-10-CM | POA: Diagnosis not present

## 2015-10-24 DIAGNOSIS — N400 Enlarged prostate without lower urinary tract symptoms: Secondary | ICD-10-CM | POA: Diagnosis not present

## 2015-11-21 DIAGNOSIS — H40013 Open angle with borderline findings, low risk, bilateral: Secondary | ICD-10-CM | POA: Diagnosis not present

## 2015-12-11 DIAGNOSIS — M5136 Other intervertebral disc degeneration, lumbar region: Secondary | ICD-10-CM | POA: Diagnosis not present

## 2016-02-08 DIAGNOSIS — N200 Calculus of kidney: Secondary | ICD-10-CM | POA: Diagnosis not present

## 2016-07-28 DIAGNOSIS — H40003 Preglaucoma, unspecified, bilateral: Secondary | ICD-10-CM | POA: Diagnosis not present

## 2016-08-01 DIAGNOSIS — H524 Presbyopia: Secondary | ICD-10-CM | POA: Diagnosis not present

## 2017-01-05 DIAGNOSIS — H401131 Primary open-angle glaucoma, bilateral, mild stage: Secondary | ICD-10-CM | POA: Diagnosis not present

## 2017-01-05 DIAGNOSIS — H2513 Age-related nuclear cataract, bilateral: Secondary | ICD-10-CM | POA: Diagnosis not present

## 2017-01-06 DIAGNOSIS — H2513 Age-related nuclear cataract, bilateral: Secondary | ICD-10-CM | POA: Insufficient documentation

## 2017-01-06 DIAGNOSIS — H401131 Primary open-angle glaucoma, bilateral, mild stage: Secondary | ICD-10-CM | POA: Insufficient documentation

## 2018-12-04 ENCOUNTER — Other Ambulatory Visit: Payer: Self-pay

## 2018-12-04 DIAGNOSIS — Z20822 Contact with and (suspected) exposure to covid-19: Secondary | ICD-10-CM

## 2018-12-05 LAB — NOVEL CORONAVIRUS, NAA: SARS-CoV-2, NAA: NOT DETECTED

## 2018-12-08 ENCOUNTER — Telehealth: Payer: Self-pay | Admitting: Hematology

## 2018-12-08 NOTE — Telephone Encounter (Signed)
Pt is aware covid 19 test is neg  

## 2019-03-24 ENCOUNTER — Ambulatory Visit: Payer: Medicare Other

## 2019-08-05 ENCOUNTER — Other Ambulatory Visit: Payer: Self-pay | Admitting: Internal Medicine

## 2019-08-05 DIAGNOSIS — N390 Urinary tract infection, site not specified: Secondary | ICD-10-CM

## 2019-08-16 ENCOUNTER — Ambulatory Visit
Admission: RE | Admit: 2019-08-16 | Discharge: 2019-08-16 | Disposition: A | Discharge status: Home / Self Care | Payer: Medicare Other | Source: Ambulatory Visit | Attending: Internal Medicine | Admitting: Internal Medicine

## 2019-08-16 DIAGNOSIS — N390 Urinary tract infection, site not specified: Secondary | ICD-10-CM

## 2020-01-26 ENCOUNTER — Ambulatory Visit (INDEPENDENT_AMBULATORY_CARE_PROVIDER_SITE_OTHER): Payer: Medicare HMO

## 2020-01-26 ENCOUNTER — Other Ambulatory Visit: Payer: Self-pay

## 2020-01-26 ENCOUNTER — Ambulatory Visit (INDEPENDENT_AMBULATORY_CARE_PROVIDER_SITE_OTHER): Payer: Medicare HMO | Admitting: Podiatry

## 2020-01-26 ENCOUNTER — Encounter: Payer: Self-pay | Admitting: Podiatry

## 2020-01-26 DIAGNOSIS — Q828 Other specified congenital malformations of skin: Secondary | ICD-10-CM

## 2020-01-26 DIAGNOSIS — M778 Other enthesopathies, not elsewhere classified: Secondary | ICD-10-CM | POA: Diagnosis not present

## 2020-01-26 NOTE — Progress Notes (Signed)
  Subjective:  Patient ID: Billy Norman., male    DOB: May 02, 1945,  MRN: [id number] HPI Chief Complaint  Patient presents with  . Foot Pain    Plantar forefoot bilateral and hallux right - multiple callused areas x years, tries to shave down and get new shoes-no help  . New Patient (Initial Visit)    74 y.o. male presents with the above complaint.   ROS: Denies fever chills nausea vomiting muscle aches pains calf pain back pain chest pain shortness of breath.  He is just discovered that he feels much better as long as he is wearing a thick soled shoe does not bend as much.  Past Medical History:  Diagnosis Date  . Anemia   . Colon polyps   . Glaucoma    Past Surgical History:  Procedure Laterality Date  . VASECTOMY      Current Outpatient Medications:  .  b complex vitamins tablet, Take 1 tablet by mouth daily., Disp: , Rfl:  .  cholecalciferol (VITAMIN D) 1000 UNITS tablet, Take 1,000 Units by mouth daily., Disp: , Rfl:  .  EPINEPHrine 0.3 mg/0.3 mL IJ SOAJ injection, SMARTSIG:1 IM As Directed, Disp: , Rfl:  .  glucosamine-chondroitin 500-400 MG tablet, Take 1 tablet by mouth daily. , Disp: , Rfl:  .  latanoprost (XALATAN) 0.005 % ophthalmic solution, SMARTSIG:In Eye(s), Disp: , Rfl:  .  Multiple Vitamin (MULTIVITAMIN) tablet, Take 1 tablet by mouth daily., Disp: , Rfl:  .  VITAMIN A PO, Take 1 tablet by mouth daily., Disp: , Rfl:  .  vitamin C (ASCORBIC ACID) 500 MG tablet, Take 3,000 mg by mouth at bedtime., Disp: , Rfl:  .  VITAMIN E PO, Take 1 tablet by mouth daily., Disp: , Rfl:   Allergies  Allergen Reactions  . Neomycin Rash   Review of Systems Objective:  There were no vitals filed for this visit.  General: Well developed, nourished, in no acute distress, alert and oriented x3   Dermatological: Skin is warm, dry and supple bilateral. Nails x 10 are well maintained; remaining integument appears unremarkable at this time. There are no open sores, no  preulcerative lesions, no rash or signs of infection present.  Vascular: Dorsalis Pedis artery and Posterior Tibial artery pedal pulses are 2/4 bilateral with immedate capillary fill time. Pedal hair growth present. No varicosities and no lower extremity edema present bilateral.   Neruologic: Grossly intact via light touch bilateral. Vibratory intact via tuning fork bilateral. Protective threshold with Semmes Wienstein monofilament intact to all pedal sites bilateral. Patellar and Achilles deep tendon reflexes 2+ bilateral. No Babinski or clonus noted bilateral.   Musculoskeletal: No gross boney pedal deformities bilateral. No pain, crepitus, or limitation noted with foot and ankle range of motion bilateral. Muscular strength 5/5 in all groups tested bilateral.  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  Mild flexible hammertoe deformities are also noted bilaterally which is resulting in plantar flexion of the metatarsal heads and superficial reactive hyperkeratosis.  Gait: Unassisted, Nonantalgic.    Radiographs:  None taken  Assessment & Plan:   Assessment: Hammertoe deformities hallux valgus deformities bilateral metatarsalgia capsulitis.  Plan: Recommended he continue to wear his stiff soled shoes.  Follow-up with me as needed     Maxine Fredman T. [place], [place]

## 2020-03-01 ENCOUNTER — Other Ambulatory Visit: Payer: Self-pay | Admitting: Family Medicine

## 2020-03-01 DIAGNOSIS — N189 Chronic kidney disease, unspecified: Secondary | ICD-10-CM

## 2020-03-21 ENCOUNTER — Other Ambulatory Visit: Payer: Self-pay | Admitting: Family Medicine

## 2020-03-21 DIAGNOSIS — R945 Abnormal results of liver function studies: Secondary | ICD-10-CM

## 2020-03-21 DIAGNOSIS — N189 Chronic kidney disease, unspecified: Secondary | ICD-10-CM

## 2020-03-23 ENCOUNTER — Ambulatory Visit
Admission: RE | Admit: 2020-03-23 | Discharge: 2020-03-23 | Disposition: A | Discharge status: Home / Self Care | Payer: Medicare HMO | Source: Ambulatory Visit | Attending: Family Medicine | Admitting: Family Medicine

## 2020-03-23 DIAGNOSIS — R945 Abnormal results of liver function studies: Secondary | ICD-10-CM

## 2020-03-23 DIAGNOSIS — N189 Chronic kidney disease, unspecified: Secondary | ICD-10-CM

## 2020-11-13 DIAGNOSIS — H401132 Primary open-angle glaucoma, bilateral, moderate stage: Secondary | ICD-10-CM | POA: Diagnosis not present

## 2020-11-13 DIAGNOSIS — H43813 Vitreous degeneration, bilateral: Secondary | ICD-10-CM | POA: Diagnosis not present

## 2021-01-30 DIAGNOSIS — R5381 Other malaise: Secondary | ICD-10-CM | POA: Diagnosis not present

## 2021-01-30 DIAGNOSIS — K760 Fatty (change of) liver, not elsewhere classified: Secondary | ICD-10-CM | POA: Diagnosis not present

## 2021-01-30 DIAGNOSIS — N400 Enlarged prostate without lower urinary tract symptoms: Secondary | ICD-10-CM | POA: Diagnosis not present

## 2021-01-30 DIAGNOSIS — N189 Chronic kidney disease, unspecified: Secondary | ICD-10-CM | POA: Diagnosis not present

## 2021-03-12 DIAGNOSIS — E559 Vitamin D deficiency, unspecified: Secondary | ICD-10-CM | POA: Diagnosis not present

## 2021-03-12 DIAGNOSIS — I10 Essential (primary) hypertension: Secondary | ICD-10-CM | POA: Diagnosis not present

## 2021-03-12 DIAGNOSIS — D709 Neutropenia, unspecified: Secondary | ICD-10-CM | POA: Diagnosis not present

## 2021-03-12 DIAGNOSIS — Z125 Encounter for screening for malignant neoplasm of prostate: Secondary | ICD-10-CM | POA: Diagnosis not present

## 2021-03-19 DIAGNOSIS — H401132 Primary open-angle glaucoma, bilateral, moderate stage: Secondary | ICD-10-CM | POA: Diagnosis not present

## 2021-03-19 DIAGNOSIS — H43813 Vitreous degeneration, bilateral: Secondary | ICD-10-CM | POA: Diagnosis not present

## 2021-05-07 DIAGNOSIS — R001 Bradycardia, unspecified: Secondary | ICD-10-CM | POA: Diagnosis not present

## 2021-05-07 DIAGNOSIS — E559 Vitamin D deficiency, unspecified: Secondary | ICD-10-CM | POA: Diagnosis not present

## 2021-05-07 DIAGNOSIS — E8881 Metabolic syndrome: Secondary | ICD-10-CM | POA: Diagnosis not present

## 2021-05-07 DIAGNOSIS — N189 Chronic kidney disease, unspecified: Secondary | ICD-10-CM | POA: Diagnosis not present

## 2021-06-06 ENCOUNTER — Other Ambulatory Visit: Payer: Self-pay | Admitting: Family Medicine

## 2021-06-06 DIAGNOSIS — N189 Chronic kidney disease, unspecified: Secondary | ICD-10-CM

## 2021-06-06 DIAGNOSIS — K760 Fatty (change of) liver, not elsewhere classified: Secondary | ICD-10-CM

## 2021-06-11 DIAGNOSIS — M5451 Vertebrogenic low back pain: Secondary | ICD-10-CM | POA: Diagnosis not present

## 2021-06-11 DIAGNOSIS — I10 Essential (primary) hypertension: Secondary | ICD-10-CM | POA: Diagnosis not present

## 2021-06-11 DIAGNOSIS — E559 Vitamin D deficiency, unspecified: Secondary | ICD-10-CM | POA: Diagnosis not present

## 2021-06-11 DIAGNOSIS — D3702 Neoplasm of uncertain behavior of tongue: Secondary | ICD-10-CM | POA: Diagnosis not present

## 2021-06-14 DIAGNOSIS — Z8616 Personal history of COVID-19: Secondary | ICD-10-CM | POA: Diagnosis not present

## 2021-06-14 DIAGNOSIS — R932 Abnormal findings on diagnostic imaging of liver and biliary tract: Secondary | ICD-10-CM | POA: Diagnosis not present

## 2021-06-14 DIAGNOSIS — I10 Essential (primary) hypertension: Secondary | ICD-10-CM | POA: Diagnosis not present

## 2021-06-14 DIAGNOSIS — D3702 Neoplasm of uncertain behavior of tongue: Secondary | ICD-10-CM | POA: Diagnosis not present

## 2021-06-19 ENCOUNTER — Ambulatory Visit
Admission: RE | Admit: 2021-06-19 | Discharge: 2021-06-19 | Disposition: A | Discharge status: Home / Self Care | Payer: Medicare Other | Source: Ambulatory Visit | Attending: Family Medicine | Admitting: Family Medicine

## 2021-06-19 DIAGNOSIS — K760 Fatty (change of) liver, not elsewhere classified: Secondary | ICD-10-CM

## 2021-06-19 DIAGNOSIS — K7689 Other specified diseases of liver: Secondary | ICD-10-CM | POA: Diagnosis not present

## 2021-06-19 DIAGNOSIS — N189 Chronic kidney disease, unspecified: Secondary | ICD-10-CM | POA: Diagnosis not present

## 2021-06-19 DIAGNOSIS — N281 Cyst of kidney, acquired: Secondary | ICD-10-CM | POA: Diagnosis not present

## 2021-06-19 DIAGNOSIS — R160 Hepatomegaly, not elsewhere classified: Secondary | ICD-10-CM | POA: Diagnosis not present

## 2021-08-20 DIAGNOSIS — K760 Fatty (change of) liver, not elsewhere classified: Secondary | ICD-10-CM | POA: Diagnosis not present

## 2021-08-20 DIAGNOSIS — R5381 Other malaise: Secondary | ICD-10-CM | POA: Diagnosis not present

## 2021-08-20 DIAGNOSIS — N189 Chronic kidney disease, unspecified: Secondary | ICD-10-CM | POA: Diagnosis not present

## 2021-08-20 DIAGNOSIS — N400 Enlarged prostate without lower urinary tract symptoms: Secondary | ICD-10-CM | POA: Diagnosis not present

## 2021-09-24 DIAGNOSIS — D72819 Decreased white blood cell count, unspecified: Secondary | ICD-10-CM | POA: Diagnosis not present

## 2021-09-24 DIAGNOSIS — D696 Thrombocytopenia, unspecified: Secondary | ICD-10-CM | POA: Diagnosis not present

## 2021-09-24 DIAGNOSIS — R5383 Other fatigue: Secondary | ICD-10-CM | POA: Diagnosis not present

## 2021-09-26 DIAGNOSIS — H43813 Vitreous degeneration, bilateral: Secondary | ICD-10-CM | POA: Diagnosis not present

## 2021-09-26 DIAGNOSIS — H401132 Primary open-angle glaucoma, bilateral, moderate stage: Secondary | ICD-10-CM | POA: Diagnosis not present

## 2021-10-03 DIAGNOSIS — K5730 Diverticulosis of large intestine without perforation or abscess without bleeding: Secondary | ICD-10-CM | POA: Diagnosis not present

## 2021-10-03 DIAGNOSIS — Z1211 Encounter for screening for malignant neoplasm of colon: Secondary | ICD-10-CM | POA: Diagnosis not present

## 2021-10-03 DIAGNOSIS — Z86010 Personal history of colonic polyps: Secondary | ICD-10-CM | POA: Diagnosis not present

## 2021-10-28 DIAGNOSIS — Z1211 Encounter for screening for malignant neoplasm of colon: Secondary | ICD-10-CM | POA: Diagnosis not present

## 2021-10-28 DIAGNOSIS — Z86010 Personal history of colonic polyps: Secondary | ICD-10-CM | POA: Diagnosis not present

## 2021-10-28 DIAGNOSIS — K5730 Diverticulosis of large intestine without perforation or abscess without bleeding: Secondary | ICD-10-CM | POA: Diagnosis not present

## 2021-12-06 DIAGNOSIS — N400 Enlarged prostate without lower urinary tract symptoms: Secondary | ICD-10-CM | POA: Diagnosis not present

## 2021-12-06 DIAGNOSIS — N189 Chronic kidney disease, unspecified: Secondary | ICD-10-CM | POA: Diagnosis not present

## 2021-12-06 DIAGNOSIS — R5383 Other fatigue: Secondary | ICD-10-CM | POA: Diagnosis not present

## 2021-12-06 DIAGNOSIS — E039 Hypothyroidism, unspecified: Secondary | ICD-10-CM | POA: Diagnosis not present

## 2021-12-23 DIAGNOSIS — M5451 Vertebrogenic low back pain: Secondary | ICD-10-CM | POA: Diagnosis not present

## 2021-12-23 DIAGNOSIS — D3702 Neoplasm of uncertain behavior of tongue: Secondary | ICD-10-CM | POA: Diagnosis not present

## 2021-12-23 DIAGNOSIS — E559 Vitamin D deficiency, unspecified: Secondary | ICD-10-CM | POA: Diagnosis not present

## 2021-12-23 DIAGNOSIS — I10 Essential (primary) hypertension: Secondary | ICD-10-CM | POA: Diagnosis not present

## 2022-01-13 ENCOUNTER — Other Ambulatory Visit: Payer: Self-pay | Admitting: Internal Medicine

## 2022-01-13 ENCOUNTER — Ambulatory Visit
Admission: RE | Admit: 2022-01-13 | Discharge: 2022-01-13 | Disposition: A | Discharge status: Home / Self Care | Payer: Medicare Other | Source: Ambulatory Visit | Attending: Internal Medicine | Admitting: Internal Medicine

## 2022-01-13 DIAGNOSIS — M25561 Pain in right knee: Secondary | ICD-10-CM

## 2022-01-17 DIAGNOSIS — Z0001 Encounter for general adult medical examination with abnormal findings: Secondary | ICD-10-CM | POA: Diagnosis not present

## 2022-01-17 DIAGNOSIS — I10 Essential (primary) hypertension: Secondary | ICD-10-CM | POA: Diagnosis not present

## 2022-01-17 DIAGNOSIS — D3702 Neoplasm of uncertain behavior of tongue: Secondary | ICD-10-CM | POA: Diagnosis not present

## 2022-01-17 DIAGNOSIS — E559 Vitamin D deficiency, unspecified: Secondary | ICD-10-CM | POA: Diagnosis not present

## 2022-01-23 DIAGNOSIS — H43813 Vitreous degeneration, bilateral: Secondary | ICD-10-CM | POA: Diagnosis not present

## 2022-01-23 DIAGNOSIS — H401132 Primary open-angle glaucoma, bilateral, moderate stage: Secondary | ICD-10-CM | POA: Diagnosis not present

## 2022-03-03 ENCOUNTER — Other Ambulatory Visit: Payer: Self-pay | Admitting: Internal Medicine

## 2022-03-03 ENCOUNTER — Ambulatory Visit
Admission: RE | Admit: 2022-03-03 | Discharge: 2022-03-03 | Disposition: A | Discharge status: Home / Self Care | Payer: Medicare Other | Source: Ambulatory Visit | Attending: Internal Medicine | Admitting: Internal Medicine

## 2022-03-03 DIAGNOSIS — M1712 Unilateral primary osteoarthritis, left knee: Secondary | ICD-10-CM | POA: Diagnosis not present

## 2022-03-03 DIAGNOSIS — M25561 Pain in right knee: Secondary | ICD-10-CM

## 2022-03-03 DIAGNOSIS — M25562 Pain in left knee: Secondary | ICD-10-CM

## 2022-03-03 DIAGNOSIS — M1711 Unilateral primary osteoarthritis, right knee: Secondary | ICD-10-CM | POA: Diagnosis not present

## 2022-03-06 DIAGNOSIS — E291 Testicular hypofunction: Secondary | ICD-10-CM | POA: Diagnosis not present

## 2022-03-06 DIAGNOSIS — R079 Chest pain, unspecified: Secondary | ICD-10-CM | POA: Diagnosis not present

## 2022-03-06 DIAGNOSIS — R7989 Other specified abnormal findings of blood chemistry: Secondary | ICD-10-CM | POA: Diagnosis not present

## 2022-03-06 DIAGNOSIS — E039 Hypothyroidism, unspecified: Secondary | ICD-10-CM | POA: Diagnosis not present

## 2022-03-06 DIAGNOSIS — K760 Fatty (change of) liver, not elsewhere classified: Secondary | ICD-10-CM | POA: Diagnosis not present

## 2022-03-06 DIAGNOSIS — R7309 Other abnormal glucose: Secondary | ICD-10-CM | POA: Diagnosis not present

## 2022-03-06 DIAGNOSIS — R002 Palpitations: Secondary | ICD-10-CM | POA: Diagnosis not present

## 2022-03-06 DIAGNOSIS — R972 Elevated prostate specific antigen [PSA]: Secondary | ICD-10-CM | POA: Diagnosis not present

## 2022-03-06 DIAGNOSIS — R5383 Other fatigue: Secondary | ICD-10-CM | POA: Diagnosis not present

## 2022-03-20 ENCOUNTER — Other Ambulatory Visit: Payer: Self-pay | Admitting: Family Medicine

## 2022-03-20 DIAGNOSIS — N281 Cyst of kidney, acquired: Secondary | ICD-10-CM

## 2022-03-20 DIAGNOSIS — K7689 Other specified diseases of liver: Secondary | ICD-10-CM

## 2022-03-25 ENCOUNTER — Other Ambulatory Visit: Payer: Self-pay | Admitting: Family Medicine

## 2022-03-25 DIAGNOSIS — M545 Low back pain, unspecified: Secondary | ICD-10-CM

## 2022-04-18 ENCOUNTER — Other Ambulatory Visit: Payer: Self-pay | Admitting: Family Medicine

## 2022-04-18 ENCOUNTER — Ambulatory Visit
Admission: RE | Admit: 2022-04-18 | Discharge: 2022-04-18 | Disposition: A | Discharge status: Home / Self Care | Payer: Medicare Other | Source: Ambulatory Visit | Attending: Family Medicine | Admitting: Family Medicine

## 2022-04-18 DIAGNOSIS — M545 Low back pain, unspecified: Secondary | ICD-10-CM

## 2022-04-18 DIAGNOSIS — N281 Cyst of kidney, acquired: Secondary | ICD-10-CM | POA: Diagnosis not present

## 2022-04-18 DIAGNOSIS — K7689 Other specified diseases of liver: Secondary | ICD-10-CM

## 2022-04-18 DIAGNOSIS — K760 Fatty (change of) liver, not elsewhere classified: Secondary | ICD-10-CM | POA: Diagnosis not present

## 2022-04-18 DIAGNOSIS — M5136 Other intervertebral disc degeneration, lumbar region: Secondary | ICD-10-CM | POA: Diagnosis not present

## 2022-05-27 DIAGNOSIS — Z7189 Other specified counseling: Secondary | ICD-10-CM | POA: Diagnosis not present

## 2022-05-27 DIAGNOSIS — I119 Hypertensive heart disease without heart failure: Secondary | ICD-10-CM | POA: Diagnosis not present

## 2022-05-27 DIAGNOSIS — N281 Cyst of kidney, acquired: Secondary | ICD-10-CM | POA: Diagnosis not present

## 2022-05-27 DIAGNOSIS — M170 Bilateral primary osteoarthritis of knee: Secondary | ICD-10-CM | POA: Diagnosis not present

## 2022-06-26 DIAGNOSIS — H43813 Vitreous degeneration, bilateral: Secondary | ICD-10-CM | POA: Diagnosis not present

## 2022-06-26 DIAGNOSIS — H401132 Primary open-angle glaucoma, bilateral, moderate stage: Secondary | ICD-10-CM | POA: Diagnosis not present

## 2022-06-30 DIAGNOSIS — R972 Elevated prostate specific antigen [PSA]: Secondary | ICD-10-CM | POA: Diagnosis not present

## 2022-06-30 DIAGNOSIS — R7989 Other specified abnormal findings of blood chemistry: Secondary | ICD-10-CM | POA: Diagnosis not present

## 2022-06-30 DIAGNOSIS — N189 Chronic kidney disease, unspecified: Secondary | ICD-10-CM | POA: Diagnosis not present

## 2022-06-30 DIAGNOSIS — R7309 Other abnormal glucose: Secondary | ICD-10-CM | POA: Diagnosis not present

## 2022-06-30 DIAGNOSIS — R5383 Other fatigue: Secondary | ICD-10-CM | POA: Diagnosis not present

## 2022-06-30 DIAGNOSIS — E559 Vitamin D deficiency, unspecified: Secondary | ICD-10-CM | POA: Diagnosis not present

## 2022-09-02 DIAGNOSIS — I119 Hypertensive heart disease without heart failure: Secondary | ICD-10-CM | POA: Diagnosis not present

## 2022-09-02 DIAGNOSIS — E559 Vitamin D deficiency, unspecified: Secondary | ICD-10-CM | POA: Diagnosis not present

## 2022-09-02 DIAGNOSIS — N281 Cyst of kidney, acquired: Secondary | ICD-10-CM | POA: Diagnosis not present

## 2022-09-02 DIAGNOSIS — D6959 Other secondary thrombocytopenia: Secondary | ICD-10-CM | POA: Diagnosis not present

## 2022-09-02 DIAGNOSIS — Z7189 Other specified counseling: Secondary | ICD-10-CM | POA: Diagnosis not present

## 2022-09-02 DIAGNOSIS — M170 Bilateral primary osteoarthritis of knee: Secondary | ICD-10-CM | POA: Diagnosis not present

## 2022-09-02 DIAGNOSIS — D709 Neutropenia, unspecified: Secondary | ICD-10-CM | POA: Diagnosis not present

## 2022-12-10 DIAGNOSIS — K760 Fatty (change of) liver, not elsewhere classified: Secondary | ICD-10-CM | POA: Diagnosis not present

## 2022-12-10 DIAGNOSIS — R5383 Other fatigue: Secondary | ICD-10-CM | POA: Diagnosis not present

## 2022-12-10 DIAGNOSIS — N189 Chronic kidney disease, unspecified: Secondary | ICD-10-CM | POA: Diagnosis not present

## 2022-12-10 DIAGNOSIS — R7989 Other specified abnormal findings of blood chemistry: Secondary | ICD-10-CM | POA: Diagnosis not present

## 2022-12-10 DIAGNOSIS — E039 Hypothyroidism, unspecified: Secondary | ICD-10-CM | POA: Diagnosis not present

## 2022-12-10 DIAGNOSIS — R7309 Other abnormal glucose: Secondary | ICD-10-CM | POA: Diagnosis not present

## 2022-12-10 DIAGNOSIS — N400 Enlarged prostate without lower urinary tract symptoms: Secondary | ICD-10-CM | POA: Diagnosis not present

## 2022-12-29 DIAGNOSIS — N400 Enlarged prostate without lower urinary tract symptoms: Secondary | ICD-10-CM | POA: Diagnosis not present

## 2022-12-29 DIAGNOSIS — R530 Neoplastic (malignant) related fatigue: Secondary | ICD-10-CM | POA: Diagnosis not present

## 2022-12-29 DIAGNOSIS — R7989 Other specified abnormal findings of blood chemistry: Secondary | ICD-10-CM | POA: Diagnosis not present

## 2023-01-02 DIAGNOSIS — I119 Hypertensive heart disease without heart failure: Secondary | ICD-10-CM | POA: Diagnosis not present

## 2023-01-02 DIAGNOSIS — Z23 Encounter for immunization: Secondary | ICD-10-CM | POA: Diagnosis not present

## 2023-01-02 DIAGNOSIS — G3184 Mild cognitive impairment, so stated: Secondary | ICD-10-CM | POA: Diagnosis not present

## 2023-01-02 DIAGNOSIS — N281 Cyst of kidney, acquired: Secondary | ICD-10-CM | POA: Diagnosis not present

## 2023-01-02 DIAGNOSIS — D709 Neutropenia, unspecified: Secondary | ICD-10-CM | POA: Diagnosis not present

## 2023-01-05 DIAGNOSIS — R7989 Other specified abnormal findings of blood chemistry: Secondary | ICD-10-CM | POA: Diagnosis not present

## 2023-01-05 DIAGNOSIS — N400 Enlarged prostate without lower urinary tract symptoms: Secondary | ICD-10-CM | POA: Diagnosis not present

## 2023-01-05 DIAGNOSIS — R530 Neoplastic (malignant) related fatigue: Secondary | ICD-10-CM | POA: Diagnosis not present

## 2023-01-05 DIAGNOSIS — R972 Elevated prostate specific antigen [PSA]: Secondary | ICD-10-CM | POA: Diagnosis not present

## 2023-01-12 DIAGNOSIS — N400 Enlarged prostate without lower urinary tract symptoms: Secondary | ICD-10-CM | POA: Diagnosis not present

## 2023-05-14 DIAGNOSIS — N281 Cyst of kidney, acquired: Secondary | ICD-10-CM | POA: Diagnosis not present

## 2023-05-14 DIAGNOSIS — E782 Mixed hyperlipidemia: Secondary | ICD-10-CM | POA: Diagnosis not present

## 2023-05-14 DIAGNOSIS — I119 Hypertensive heart disease without heart failure: Secondary | ICD-10-CM | POA: Diagnosis not present

## 2023-05-14 DIAGNOSIS — N401 Enlarged prostate with lower urinary tract symptoms: Secondary | ICD-10-CM | POA: Diagnosis not present

## 2023-05-14 DIAGNOSIS — D704 Cyclic neutropenia: Secondary | ICD-10-CM | POA: Diagnosis not present

## 2023-05-14 DIAGNOSIS — R7303 Prediabetes: Secondary | ICD-10-CM | POA: Diagnosis not present

## 2023-05-14 DIAGNOSIS — E559 Vitamin D deficiency, unspecified: Secondary | ICD-10-CM | POA: Diagnosis not present

## 2023-05-14 DIAGNOSIS — R8290 Unspecified abnormal findings in urine: Secondary | ICD-10-CM | POA: Diagnosis not present

## 2024-01-28 DIAGNOSIS — Z23 Encounter for immunization: Secondary | ICD-10-CM | POA: Diagnosis not present
